# Patient Record
Sex: Female | Born: 1935 | Race: White | Hispanic: No | Smoking: Former smoker
Health system: Southern US, Community
[De-identification: ages and names within clinical notes are randomized; demographics above are authoritative.]

## PROBLEM LIST (undated history)

## (undated) DIAGNOSIS — M47815 Spondylosis without myelopathy or radiculopathy, thoracolumbar region: Secondary | ICD-10-CM

## (undated) DIAGNOSIS — I350 Nonrheumatic aortic (valve) stenosis: Secondary | ICD-10-CM

## (undated) DIAGNOSIS — Z9289 Personal history of other medical treatment: Secondary | ICD-10-CM

## (undated) DIAGNOSIS — I2510 Atherosclerotic heart disease of native coronary artery without angina pectoris: Secondary | ICD-10-CM

## (undated) DIAGNOSIS — Z951 Presence of aortocoronary bypass graft: Secondary | ICD-10-CM

## (undated) DIAGNOSIS — K2970 Gastritis, unspecified, without bleeding: Secondary | ICD-10-CM

## (undated) DIAGNOSIS — I2129 ST elevation (STEMI) myocardial infarction involving other sites: Secondary | ICD-10-CM

## (undated) DIAGNOSIS — M549 Dorsalgia, unspecified: Secondary | ICD-10-CM

## (undated) DIAGNOSIS — K55059 Acute (reversible) ischemia of intestine, part and extent unspecified: Secondary | ICD-10-CM

## (undated) DIAGNOSIS — G8929 Other chronic pain: Secondary | ICD-10-CM

## (undated) DIAGNOSIS — Z9889 Other specified postprocedural states: Secondary | ICD-10-CM

## (undated) DIAGNOSIS — I739 Peripheral vascular disease, unspecified: Secondary | ICD-10-CM

## (undated) DIAGNOSIS — E785 Hyperlipidemia, unspecified: Secondary | ICD-10-CM

## (undated) DIAGNOSIS — K565 Intestinal adhesions [bands], unspecified as to partial versus complete obstruction: Secondary | ICD-10-CM

## (undated) DIAGNOSIS — I25810 Atherosclerosis of coronary artery bypass graft(s) without angina pectoris: Secondary | ICD-10-CM

## (undated) DIAGNOSIS — I779 Disorder of arteries and arterioles, unspecified: Secondary | ICD-10-CM

## (undated) DIAGNOSIS — Z8679 Personal history of other diseases of the circulatory system: Secondary | ICD-10-CM

## (undated) DIAGNOSIS — K3184 Gastroparesis: Secondary | ICD-10-CM

## (undated) DIAGNOSIS — I701 Atherosclerosis of renal artery: Secondary | ICD-10-CM

## (undated) DIAGNOSIS — M479 Spondylosis, unspecified: Secondary | ICD-10-CM

## (undated) DIAGNOSIS — I10 Essential (primary) hypertension: Secondary | ICD-10-CM

## (undated) DIAGNOSIS — I771 Stricture of artery: Secondary | ICD-10-CM

## (undated) DIAGNOSIS — K279 Peptic ulcer, site unspecified, unspecified as acute or chronic, without hemorrhage or perforation: Secondary | ICD-10-CM

## (undated) HISTORY — DX: Spondylosis without myelopathy or radiculopathy, thoracolumbar region: M47.815

## (undated) HISTORY — DX: Personal history of other diseases of the circulatory system: Z86.79

## (undated) HISTORY — DX: Gastritis, unspecified, without bleeding: K29.70

## (undated) HISTORY — DX: Dorsalgia, unspecified: M54.9

## (undated) HISTORY — DX: Stricture of artery: I77.1

## (undated) HISTORY — DX: Presence of aortocoronary bypass graft: Z95.1

## (undated) HISTORY — DX: Peptic ulcer, site unspecified, unspecified as acute or chronic, without hemorrhage or perforation: K27.9

## (undated) HISTORY — DX: Spondylosis, unspecified: M47.9

## (undated) HISTORY — DX: ST elevation (STEMI) myocardial infarction involving other sites: I21.29

## (undated) HISTORY — DX: Intestinal adhesions (bands), unspecified as to partial versus complete obstruction: K56.50

## (undated) HISTORY — DX: Gastroparesis: K31.84

## (undated) HISTORY — DX: Atherosclerotic heart disease of native coronary artery without angina pectoris: I25.10

## (undated) HISTORY — DX: Disorder of arteries and arterioles, unspecified: I77.9

## (undated) HISTORY — DX: Peripheral vascular disease, unspecified: I73.9

## (undated) HISTORY — DX: Hyperlipidemia, unspecified: E78.5

## (undated) HISTORY — DX: Other specified postprocedural states: Z98.890

## (undated) HISTORY — DX: Atherosclerosis of coronary artery bypass graft(s) without angina pectoris: I25.810

## (undated) HISTORY — DX: Other chronic pain: G89.29

## (undated) HISTORY — PX: FEMORAL-TIBIAL BYPASS GRAFT: SHX938

## (undated) HISTORY — PX: CAROTID ENDARTERECTOMY: SUR193

## (undated) HISTORY — DX: Essential (primary) hypertension: I10

## (undated) HISTORY — DX: Nonrheumatic aortic (valve) stenosis: I35.0

## (undated) HISTORY — DX: Acute (reversible) ischemia of intestine, part and extent unspecified: K55.059

## (undated) HISTORY — DX: Atherosclerosis of renal artery: I70.1

## (undated) HISTORY — PX: CAROTID ARTERY - SUBCLAVIAN ARTERY BYPASS GRAFT: SUR178

## (undated) HISTORY — DX: Personal history of other medical treatment: Z92.89

---

## 1974-08-29 DIAGNOSIS — I2129 ST elevation (STEMI) myocardial infarction involving other sites: Secondary | ICD-10-CM

## 1974-08-29 HISTORY — DX: ST elevation (STEMI) myocardial infarction involving other sites: I21.29

## 1996-08-29 HISTORY — PX: OTHER SURGICAL HISTORY: SHX169

## 1996-08-29 HISTORY — PX: PR VEIN BYPASS GRAFT,AORTO-FEM-POP: 35551

## 1998-08-29 DIAGNOSIS — Z951 Presence of aortocoronary bypass graft: Secondary | ICD-10-CM

## 1998-08-29 HISTORY — PX: CORONARY ARTERY BYPASS GRAFT: SHX141

## 1998-08-29 HISTORY — DX: Presence of aortocoronary bypass graft: Z95.1

## 1999-03-29 ENCOUNTER — Emergency Department (HOSPITAL_COMMUNITY): Admission: EM | Admit: 1999-03-29 | Discharge: 1999-03-29 | Payer: Self-pay | Admitting: Emergency Medicine

## 1999-04-02 ENCOUNTER — Emergency Department (HOSPITAL_COMMUNITY): Admission: EM | Admit: 1999-04-02 | Discharge: 1999-04-02 | Payer: Self-pay | Admitting: Emergency Medicine

## 1999-05-28 ENCOUNTER — Encounter: Payer: Self-pay | Admitting: Emergency Medicine

## 1999-05-28 ENCOUNTER — Inpatient Hospital Stay (HOSPITAL_COMMUNITY): Admission: EM | Admit: 1999-05-28 | Discharge: 1999-06-09 | Payer: Self-pay | Admitting: Cardiology

## 1999-06-01 ENCOUNTER — Encounter: Payer: Self-pay | Admitting: Cardiology

## 1999-06-03 ENCOUNTER — Encounter: Payer: Self-pay | Admitting: Cardiothoracic Surgery

## 1999-06-04 ENCOUNTER — Encounter: Payer: Self-pay | Admitting: Cardiothoracic Surgery

## 1999-06-05 ENCOUNTER — Encounter: Payer: Self-pay | Admitting: Cardiothoracic Surgery

## 1999-06-06 ENCOUNTER — Encounter: Payer: Self-pay | Admitting: Cardiothoracic Surgery

## 1999-06-07 ENCOUNTER — Encounter: Payer: Self-pay | Admitting: Cardiothoracic Surgery

## 2000-02-21 ENCOUNTER — Other Ambulatory Visit: Admission: RE | Admit: 2000-02-21 | Discharge: 2000-02-21 | Payer: Self-pay | Admitting: Obstetrics and Gynecology

## 2001-03-23 ENCOUNTER — Other Ambulatory Visit: Admission: RE | Admit: 2001-03-23 | Discharge: 2001-03-23 | Payer: Self-pay | Admitting: Obstetrics and Gynecology

## 2002-08-29 HISTORY — PX: COLON SURGERY: SHX602

## 2002-08-29 HISTORY — PX: CORONARY STENT PLACEMENT: SHX1402

## 2002-08-29 HISTORY — PX: RENAL ARTERY STENT: SHX2321

## 2002-09-29 DIAGNOSIS — Z8679 Personal history of other diseases of the circulatory system: Secondary | ICD-10-CM

## 2002-09-29 DIAGNOSIS — I25810 Atherosclerosis of coronary artery bypass graft(s) without angina pectoris: Secondary | ICD-10-CM

## 2002-09-29 HISTORY — DX: Atherosclerosis of coronary artery bypass graft(s) without angina pectoris: I25.810

## 2002-09-29 HISTORY — DX: Personal history of other diseases of the circulatory system: Z86.79

## 2002-09-30 ENCOUNTER — Inpatient Hospital Stay (HOSPITAL_COMMUNITY): Admission: EM | Admit: 2002-09-30 | Discharge: 2002-10-03 | Payer: Self-pay | Admitting: Emergency Medicine

## 2002-10-02 ENCOUNTER — Encounter (INDEPENDENT_AMBULATORY_CARE_PROVIDER_SITE_OTHER): Payer: Self-pay | Admitting: Specialist

## 2002-12-04 ENCOUNTER — Encounter: Payer: Self-pay | Admitting: Cardiovascular Disease

## 2002-12-04 ENCOUNTER — Ambulatory Visit (HOSPITAL_COMMUNITY): Admission: RE | Admit: 2002-12-04 | Discharge: 2002-12-07 | Payer: Self-pay | Admitting: Cardiovascular Disease

## 2003-05-30 DIAGNOSIS — K565 Intestinal adhesions [bands], unspecified as to partial versus complete obstruction: Secondary | ICD-10-CM

## 2003-05-30 HISTORY — PX: LAPAROSCOPIC LYSIS OF ADHESIONS: SHX5905

## 2003-05-30 HISTORY — DX: Intestinal adhesions (bands), unspecified as to partial versus complete obstruction: K56.50

## 2003-06-09 ENCOUNTER — Encounter: Payer: Self-pay | Admitting: Internal Medicine

## 2003-06-09 ENCOUNTER — Ambulatory Visit (HOSPITAL_COMMUNITY): Admission: RE | Admit: 2003-06-09 | Discharge: 2003-06-09 | Payer: Self-pay | Admitting: Internal Medicine

## 2003-06-13 ENCOUNTER — Encounter: Payer: Self-pay | Admitting: Emergency Medicine

## 2003-06-14 ENCOUNTER — Encounter: Payer: Self-pay | Admitting: Emergency Medicine

## 2003-06-14 ENCOUNTER — Encounter: Payer: Self-pay | Admitting: Internal Medicine

## 2003-06-15 ENCOUNTER — Inpatient Hospital Stay (HOSPITAL_COMMUNITY): Admission: EM | Admit: 2003-06-15 | Discharge: 2003-06-25 | Payer: Self-pay | Admitting: Emergency Medicine

## 2003-06-16 ENCOUNTER — Encounter: Payer: Self-pay | Admitting: Internal Medicine

## 2003-06-27 ENCOUNTER — Inpatient Hospital Stay (HOSPITAL_COMMUNITY): Admission: EM | Admit: 2003-06-27 | Discharge: 2003-07-01 | Payer: Self-pay | Admitting: Emergency Medicine

## 2003-07-08 ENCOUNTER — Inpatient Hospital Stay (HOSPITAL_COMMUNITY): Admission: EM | Admit: 2003-07-08 | Discharge: 2003-07-12 | Payer: Self-pay | Admitting: Emergency Medicine

## 2004-08-24 ENCOUNTER — Encounter: Admission: RE | Admit: 2004-08-24 | Discharge: 2004-11-02 | Payer: Self-pay | Admitting: Internal Medicine

## 2004-11-06 ENCOUNTER — Emergency Department (HOSPITAL_COMMUNITY): Admission: EM | Admit: 2004-11-06 | Discharge: 2004-11-06 | Payer: Self-pay | Admitting: Emergency Medicine

## 2005-02-15 ENCOUNTER — Encounter: Admission: RE | Admit: 2005-02-15 | Discharge: 2005-02-15 | Payer: Self-pay | Admitting: Gastroenterology

## 2005-02-25 ENCOUNTER — Inpatient Hospital Stay (HOSPITAL_COMMUNITY): Admission: EM | Admit: 2005-02-25 | Discharge: 2005-03-02 | Payer: Self-pay | Admitting: Emergency Medicine

## 2005-03-05 ENCOUNTER — Emergency Department (HOSPITAL_COMMUNITY): Admission: EM | Admit: 2005-03-05 | Discharge: 2005-03-05 | Payer: Self-pay | Admitting: Emergency Medicine

## 2005-04-27 ENCOUNTER — Ambulatory Visit (HOSPITAL_COMMUNITY): Admission: RE | Admit: 2005-04-27 | Discharge: 2005-04-27 | Payer: Self-pay | Admitting: Gastroenterology

## 2005-07-02 ENCOUNTER — Emergency Department (HOSPITAL_COMMUNITY): Admission: EM | Admit: 2005-07-02 | Discharge: 2005-07-02 | Payer: Self-pay | Admitting: Emergency Medicine

## 2005-08-29 HISTORY — PX: SUPERIOR MESTENTERIC ARTERY STENT: SHX2461

## 2005-10-18 ENCOUNTER — Encounter: Admission: RE | Admit: 2005-10-18 | Discharge: 2005-10-18 | Payer: Self-pay | Admitting: Gastroenterology

## 2006-01-10 ENCOUNTER — Inpatient Hospital Stay (HOSPITAL_COMMUNITY): Admission: EM | Admit: 2006-01-10 | Discharge: 2006-01-20 | Payer: Self-pay | Admitting: Emergency Medicine

## 2006-01-13 ENCOUNTER — Encounter (INDEPENDENT_AMBULATORY_CARE_PROVIDER_SITE_OTHER): Payer: Self-pay | Admitting: *Deleted

## 2006-01-31 ENCOUNTER — Encounter: Admission: RE | Admit: 2006-01-31 | Discharge: 2006-01-31 | Payer: Self-pay | Admitting: Interventional Radiology

## 2007-01-12 ENCOUNTER — Inpatient Hospital Stay (HOSPITAL_COMMUNITY): Admission: EM | Admit: 2007-01-12 | Discharge: 2007-01-17 | Payer: Self-pay | Admitting: Emergency Medicine

## 2007-04-08 ENCOUNTER — Emergency Department (HOSPITAL_COMMUNITY): Admission: EM | Admit: 2007-04-08 | Discharge: 2007-04-08 | Payer: Self-pay | Admitting: Emergency Medicine

## 2007-06-01 ENCOUNTER — Ambulatory Visit: Payer: Self-pay | Admitting: Vascular Surgery

## 2008-06-12 ENCOUNTER — Encounter: Admission: RE | Admit: 2008-06-12 | Discharge: 2008-06-12 | Payer: Self-pay | Admitting: Internal Medicine

## 2008-07-04 ENCOUNTER — Encounter: Admission: RE | Admit: 2008-07-04 | Discharge: 2008-07-04 | Payer: Self-pay | Admitting: Internal Medicine

## 2008-07-11 ENCOUNTER — Ambulatory Visit: Payer: Self-pay | Admitting: Vascular Surgery

## 2008-08-29 HISTORY — PX: CARDIAC CATHETERIZATION: SHX172

## 2008-12-27 ENCOUNTER — Inpatient Hospital Stay (HOSPITAL_COMMUNITY): Admission: EM | Admit: 2008-12-27 | Discharge: 2009-01-06 | Payer: Self-pay | Admitting: Emergency Medicine

## 2008-12-27 DIAGNOSIS — Z9889 Other specified postprocedural states: Secondary | ICD-10-CM

## 2008-12-27 HISTORY — DX: Other specified postprocedural states: Z98.890

## 2009-04-16 ENCOUNTER — Ambulatory Visit: Payer: Self-pay | Admitting: Surgery

## 2009-04-25 ENCOUNTER — Emergency Department (HOSPITAL_COMMUNITY): Admission: EM | Admit: 2009-04-25 | Discharge: 2009-04-25 | Payer: Self-pay | Admitting: Emergency Medicine

## 2009-04-27 ENCOUNTER — Ambulatory Visit: Payer: Self-pay | Admitting: Vascular Surgery

## 2009-05-24 ENCOUNTER — Emergency Department (HOSPITAL_COMMUNITY): Admission: EM | Admit: 2009-05-24 | Discharge: 2009-05-24 | Payer: Self-pay | Admitting: Emergency Medicine

## 2009-07-10 ENCOUNTER — Ambulatory Visit (HOSPITAL_COMMUNITY): Admission: RE | Admit: 2009-07-10 | Discharge: 2009-07-10 | Payer: Self-pay | Admitting: Cardiovascular Disease

## 2009-08-14 ENCOUNTER — Inpatient Hospital Stay (HOSPITAL_COMMUNITY): Admission: EM | Admit: 2009-08-14 | Discharge: 2009-08-18 | Payer: Self-pay | Admitting: Emergency Medicine

## 2009-08-15 ENCOUNTER — Encounter (INDEPENDENT_AMBULATORY_CARE_PROVIDER_SITE_OTHER): Payer: Self-pay | Admitting: Internal Medicine

## 2009-08-17 ENCOUNTER — Ambulatory Visit: Payer: Self-pay | Admitting: Vascular Surgery

## 2009-08-17 ENCOUNTER — Encounter (INDEPENDENT_AMBULATORY_CARE_PROVIDER_SITE_OTHER): Payer: Self-pay | Admitting: Internal Medicine

## 2009-09-04 ENCOUNTER — Ambulatory Visit: Payer: Self-pay | Admitting: Vascular Surgery

## 2010-09-14 ENCOUNTER — Ambulatory Visit: Admit: 2010-09-14 | Payer: Self-pay | Admitting: Vascular Surgery

## 2010-11-29 LAB — DIFFERENTIAL
Basophils Absolute: 0 10*3/uL (ref 0.0–0.1)
Basophils Relative: 1 % (ref 0–1)
Eosinophils Absolute: 0.2 10*3/uL (ref 0.0–0.7)
Lymphocytes Relative: 25 % (ref 12–46)
Lymphs Abs: 1.3 10*3/uL (ref 0.7–4.0)
Monocytes Relative: 9 % (ref 3–12)
Neutro Abs: 3 10*3/uL (ref 1.7–7.7)
Neutrophils Relative %: 59 % (ref 43–77)

## 2010-11-29 LAB — PROTIME-INR: Prothrombin Time: 13 seconds (ref 11.6–15.2)

## 2010-11-29 LAB — TROPONIN I: Troponin I: 0.01 ng/mL (ref 0.00–0.06)

## 2010-11-29 LAB — CBC
HCT: 34.5 % — ABNORMAL LOW (ref 36.0–46.0)
HCT: 36.4 % (ref 36.0–46.0)
Hemoglobin: 11.6 g/dL — ABNORMAL LOW (ref 12.0–15.0)
Hemoglobin: 11.9 g/dL — ABNORMAL LOW (ref 12.0–15.0)
MCHC: 33 g/dL (ref 30.0–36.0)
MCV: 86 fL (ref 78.0–100.0)
Platelets: 160 10*3/uL (ref 150–400)
Platelets: 168 10*3/uL (ref 150–400)
RBC: 4.14 MIL/uL (ref 3.87–5.11)
RDW: 13.6 % (ref 11.5–15.5)
RDW: 13.7 % (ref 11.5–15.5)
WBC: 5 10*3/uL (ref 4.0–10.5)

## 2010-11-29 LAB — URINE CULTURE
Colony Count: NO GROWTH
Culture: NO GROWTH

## 2010-11-29 LAB — COMPREHENSIVE METABOLIC PANEL
ALT: 10 U/L (ref 0–35)
ALT: 12 U/L (ref 0–35)
AST: 20 U/L (ref 0–37)
Alkaline Phosphatase: 46 U/L (ref 39–117)
Alkaline Phosphatase: 54 U/L (ref 39–117)
CO2: 28 mEq/L (ref 19–32)
CO2: 30 mEq/L (ref 19–32)
Calcium: 9.8 mg/dL (ref 8.4–10.5)
Chloride: 107 mEq/L (ref 96–112)
GFR calc Af Amer: 56 mL/min — ABNORMAL LOW (ref >60)
GFR calc non Af Amer: 47 mL/min — ABNORMAL LOW (ref >60)
GFR calc non Af Amer: 52 mL/min — ABNORMAL LOW (ref >60)
Glucose, Bld: 109 mg/dL — ABNORMAL HIGH (ref 70–99)
Potassium: 4.3 mEq/L (ref 3.5–5.1)
Potassium: 4.6 mEq/L (ref 3.5–5.1)
Sodium: 139 mEq/L (ref 135–145)
Sodium: 141 mEq/L (ref 135–145)

## 2010-11-29 LAB — GLUCOSE, CAPILLARY
Glucose-Capillary: 102 mg/dL — ABNORMAL HIGH (ref 70–99)
Glucose-Capillary: 108 mg/dL — ABNORMAL HIGH (ref 70–99)
Glucose-Capillary: 115 mg/dL — ABNORMAL HIGH (ref 70–99)
Glucose-Capillary: 117 mg/dL — ABNORMAL HIGH (ref 70–99)
Glucose-Capillary: 122 mg/dL — ABNORMAL HIGH (ref 70–99)
Glucose-Capillary: 126 mg/dL — ABNORMAL HIGH (ref 70–99)
Glucose-Capillary: 128 mg/dL — ABNORMAL HIGH (ref 70–99)
Glucose-Capillary: 149 mg/dL — ABNORMAL HIGH (ref 70–99)
Glucose-Capillary: 96 mg/dL (ref 70–99)

## 2010-11-29 LAB — BASIC METABOLIC PANEL
BUN: 10 mg/dL (ref 6–23)
BUN: 11 mg/dL (ref 6–23)
CO2: 27 mEq/L (ref 19–32)
CO2: 28 mEq/L (ref 19–32)
Calcium: 9.3 mg/dL (ref 8.4–10.5)
Chloride: 109 mEq/L (ref 96–112)
GFR calc Af Amer: >60 mL/min (ref >60)
GFR calc non Af Amer: 50 mL/min — ABNORMAL LOW (ref >60)
GFR calc non Af Amer: 58 mL/min — ABNORMAL LOW (ref >60)
GFR calc non Af Amer: >60 mL/min (ref >60)
Glucose, Bld: 106 mg/dL — ABNORMAL HIGH (ref 70–99)
Glucose, Bld: 114 mg/dL — ABNORMAL HIGH (ref 70–99)
Potassium: 3.3 mEq/L — ABNORMAL LOW (ref 3.5–5.1)
Potassium: 3.8 mEq/L (ref 3.5–5.1)
Sodium: 143 mEq/L (ref 135–145)

## 2010-11-29 LAB — URINALYSIS, ROUTINE W REFLEX MICROSCOPIC
Bilirubin Urine: NEGATIVE
Ketones, ur: NEGATIVE mg/dL
Leukocytes, UA: NEGATIVE
Nitrite: NEGATIVE
Protein, ur: NEGATIVE mg/dL
Urobilinogen, UA: 1 mg/dL (ref 0.0–1.0)

## 2010-11-29 LAB — LIPID PANEL
Cholesterol: 93 mg/dL (ref 0–200)
LDL Cholesterol: 39 mg/dL (ref 0–99)
VLDL: 26 mg/dL (ref 0–40)

## 2010-11-29 LAB — CARDIAC PANEL(CRET KIN+CKTOT+MB+TROPI)
CK, MB: 1.6 ng/mL (ref 0.3–4.0)
Total CK: 51 U/L (ref 7–177)
Total CK: 66 U/L (ref 7–177)

## 2010-11-29 LAB — URINE MICROSCOPIC-ADD ON

## 2010-11-29 LAB — PHOSPHORUS
Phosphorus: 3 mg/dL (ref 2.3–4.6)
Phosphorus: 3.4 mg/dL (ref 2.3–4.6)

## 2010-11-29 LAB — POCT CARDIAC MARKERS
CKMB, poc: <1 ng/mL — ABNORMAL LOW (ref 1.0–8.0)
Myoglobin, poc: 58.7 ng/mL (ref 12–200)

## 2010-11-29 LAB — CK TOTAL AND CKMB (NOT AT ARMC): Relative Index: INVALID (ref 0.0–2.5)

## 2010-11-29 LAB — MAGNESIUM: Magnesium: 1.8 mg/dL (ref 1.5–2.5)

## 2010-12-03 LAB — DIFFERENTIAL
Basophils Absolute: 0 10*3/uL (ref 0.0–0.1)
Basophils Relative: 0 % (ref 0–1)
Eosinophils Absolute: 0.2 10*3/uL (ref 0.0–0.7)
Eosinophils Relative: 4 % (ref 0–5)
Lymphs Abs: 1.3 10*3/uL (ref 0.7–4.0)
Neutrophils Relative %: 57 % (ref 43–77)

## 2010-12-03 LAB — COMPREHENSIVE METABOLIC PANEL
ALT: 13 U/L (ref 0–35)
AST: 21 U/L (ref 0–37)
Alkaline Phosphatase: 55 U/L (ref 39–117)
CO2: 30 mEq/L (ref 19–32)
Calcium: 9.6 mg/dL (ref 8.4–10.5)
Chloride: 103 mEq/L (ref 96–112)
GFR calc Af Amer: 59 mL/min — ABNORMAL LOW (ref >60)
GFR calc non Af Amer: 49 mL/min — ABNORMAL LOW (ref >60)
Glucose, Bld: 132 mg/dL — ABNORMAL HIGH (ref 70–99)
Sodium: 139 mEq/L (ref 135–145)
Total Bilirubin: 0.7 mg/dL (ref 0.3–1.2)

## 2010-12-03 LAB — CBC
Hemoglobin: 11.4 g/dL — ABNORMAL LOW (ref 12.0–15.0)
MCHC: 33.8 g/dL (ref 30.0–36.0)
RBC: 3.9 MIL/uL (ref 3.87–5.11)
WBC: 4.8 10*3/uL (ref 4.0–10.5)

## 2010-12-03 LAB — URINALYSIS, ROUTINE W REFLEX MICROSCOPIC
Bilirubin Urine: NEGATIVE
Hgb urine dipstick: NEGATIVE
Nitrite: NEGATIVE
Specific Gravity, Urine: 1.029 (ref 1.005–1.030)
pH: 5.5 (ref 5.0–8.0)

## 2010-12-03 LAB — POCT CARDIAC MARKERS: Myoglobin, poc: 80.1 ng/mL (ref 12–200)

## 2010-12-04 LAB — CBC
HCT: 34.2 % — ABNORMAL LOW (ref 36.0–46.0)
MCHC: 33.4 g/dL (ref 30.0–36.0)
MCV: 87.6 fL (ref 78.0–100.0)
Platelets: 166 10*3/uL (ref 150–400)
RDW: 13.4 % (ref 11.5–15.5)
WBC: 5.3 10*3/uL (ref 4.0–10.5)

## 2010-12-04 LAB — DIFFERENTIAL
Basophils Relative: 0 % (ref 0–1)
Eosinophils Absolute: 0.1 10*3/uL (ref 0.0–0.7)
Eosinophils Relative: 3 % (ref 0–5)
Lymphs Abs: 1.2 10*3/uL (ref 0.7–4.0)
Neutrophils Relative %: 62 % (ref 43–77)

## 2010-12-04 LAB — POCT I-STAT, CHEM 8
BUN: 24 mg/dL — ABNORMAL HIGH (ref 6–23)
Calcium, Ion: 1.23 mmol/L (ref 1.12–1.32)
TCO2: 27 mmol/L (ref 0–100)

## 2010-12-04 LAB — PROTIME-INR
INR: 1.1 (ref 0.00–1.49)
Prothrombin Time: 13.7 s (ref 11.6–15.2)

## 2010-12-04 LAB — APTT

## 2010-12-07 LAB — COMPREHENSIVE METABOLIC PANEL
Alkaline Phosphatase: 47 U/L (ref 39–117)
BUN: 25 mg/dL — ABNORMAL HIGH (ref 6–23)
Chloride: 110 mEq/L (ref 96–112)
Glucose, Bld: 95 mg/dL (ref 70–99)
Potassium: 3.9 mEq/L (ref 3.5–5.1)
Total Bilirubin: 0.4 mg/dL (ref 0.3–1.2)

## 2010-12-07 LAB — BASIC METABOLIC PANEL
BUN: 14 mg/dL (ref 6–23)
BUN: 16 mg/dL (ref 6–23)
BUN: 20 mg/dL (ref 6–23)
BUN: 28 mg/dL — ABNORMAL HIGH (ref 6–23)
BUN: 30 mg/dL — ABNORMAL HIGH (ref 6–23)
CO2: 22 mEq/L (ref 19–32)
CO2: 22 mEq/L (ref 19–32)
CO2: 24 mEq/L (ref 19–32)
CO2: 25 mEq/L (ref 19–32)
CO2: 26 mEq/L (ref 19–32)
CO2: 27 mEq/L (ref 19–32)
CO2: 27 mEq/L (ref 19–32)
Calcium: 9.2 mg/dL (ref 8.4–10.5)
Calcium: 9.3 mg/dL (ref 8.4–10.5)
Calcium: 9.6 mg/dL (ref 8.4–10.5)
Chloride: 104 mEq/L (ref 96–112)
Chloride: 105 mEq/L (ref 96–112)
Chloride: 105 mEq/L (ref 96–112)
Chloride: 108 mEq/L (ref 96–112)
Chloride: 109 mEq/L (ref 96–112)
Chloride: 113 mEq/L — ABNORMAL HIGH (ref 96–112)
Creatinine, Ser: 0.96 mg/dL (ref 0.4–1.2)
Creatinine, Ser: 0.96 mg/dL (ref 0.4–1.2)
Creatinine, Ser: 1.09 mg/dL (ref 0.4–1.2)
Creatinine, Ser: 1.27 mg/dL — ABNORMAL HIGH (ref 0.4–1.2)
Creatinine, Ser: 1.3 mg/dL — ABNORMAL HIGH (ref 0.4–1.2)
GFR calc Af Amer: >60 mL/min (ref >60)
GFR calc Af Amer: >60 mL/min (ref >60)
GFR calc non Af Amer: >60 mL/min (ref >60)
Glucose, Bld: 117 mg/dL — ABNORMAL HIGH (ref 70–99)
Glucose, Bld: 144 mg/dL — ABNORMAL HIGH (ref 70–99)
Glucose, Bld: 177 mg/dL — ABNORMAL HIGH (ref 70–99)
Glucose, Bld: 91 mg/dL (ref 70–99)
Potassium: 3.5 mEq/L (ref 3.5–5.1)
Potassium: 3.6 mEq/L (ref 3.5–5.1)
Potassium: 4.8 mEq/L (ref 3.5–5.1)
Sodium: 138 mEq/L (ref 135–145)
Sodium: 139 mEq/L (ref 135–145)

## 2010-12-07 LAB — DIFFERENTIAL
Basophils Relative: 0 % (ref 0–1)
Eosinophils Absolute: 0.1 10*3/uL (ref 0.0–0.7)
Lymphs Abs: 0.9 10*3/uL (ref 0.7–4.0)
Neutrophils Relative %: 74 % (ref 43–77)

## 2010-12-07 LAB — CBC
HCT: 35.7 % — ABNORMAL LOW (ref 36.0–46.0)
HCT: 37.7 % (ref 36.0–46.0)
MCHC: 34.4 g/dL (ref 30.0–36.0)
MCHC: 34.5 g/dL (ref 30.0–36.0)
MCHC: 34.6 g/dL (ref 30.0–36.0)
MCHC: 34.8 g/dL (ref 30.0–36.0)
MCV: 89.8 fL (ref 78.0–100.0)
MCV: 90.3 fL (ref 78.0–100.0)
MCV: 90.4 fL (ref 78.0–100.0)
MCV: 90.5 fL (ref 78.0–100.0)
MCV: 91.4 fL (ref 78.0–100.0)
Platelets: 203 10*3/uL (ref 150–400)
Platelets: 239 10*3/uL (ref 150–400)
Platelets: 256 10*3/uL (ref 150–400)
RBC: 3.95 MIL/uL (ref 3.87–5.11)
RDW: 13.6 % (ref 11.5–15.5)
WBC: 5.6 10*3/uL (ref 4.0–10.5)
WBC: 7.4 10*3/uL (ref 4.0–10.5)
WBC: 9.1 10*3/uL (ref 4.0–10.5)

## 2010-12-07 LAB — PROTIME-INR
INR: 1 (ref 0.00–1.49)
INR: 1.1 (ref 0.00–1.49)
Prothrombin Time: 13.8 seconds (ref 11.6–15.2)
Prothrombin Time: 14.6 seconds (ref 11.6–15.2)

## 2010-12-07 LAB — MAGNESIUM: Magnesium: 2.1 mg/dL (ref 1.5–2.5)

## 2010-12-07 LAB — CARDIAC PANEL(CRET KIN+CKTOT+MB+TROPI)
CK, MB: 1.6 ng/mL (ref 0.3–4.0)
CK, MB: 2.1 ng/mL (ref 0.3–4.0)
Relative Index: INVALID (ref 0.0–2.5)
Relative Index: INVALID (ref 0.0–2.5)
Total CK: 27 U/L (ref 7–177)
Total CK: 38 U/L (ref 7–177)

## 2010-12-07 LAB — POCT CARDIAC MARKERS
CKMB, poc: <1 ng/mL — ABNORMAL LOW (ref 1.0–8.0)
Myoglobin, poc: 53.9 ng/mL (ref 12–200)

## 2010-12-07 LAB — HEPARIN LEVEL (UNFRACTIONATED)
Heparin Unfractionated: 0.68 IU/mL (ref 0.30–0.70)
Heparin Unfractionated: 0.76 IU/mL — ABNORMAL HIGH (ref 0.30–0.70)

## 2010-12-07 LAB — LIPID PANEL
LDL Cholesterol: 59 mg/dL (ref 0–99)
Triglycerides: 95 mg/dL (ref <150)

## 2010-12-07 LAB — POCT I-STAT, CHEM 8
HCT: 35 % — ABNORMAL LOW (ref 36.0–46.0)
Hemoglobin: 11.9 g/dL — ABNORMAL LOW (ref 12.0–15.0)
Potassium: 3.9 mEq/L (ref 3.5–5.1)
Sodium: 142 mEq/L (ref 135–145)

## 2011-01-11 NOTE — Discharge Summary (Signed)
Betty Floyd, Betty Floyd             ACCOUNT NO.:  [id number]   MEDICAL RECORD NO.:  [id number]          PATIENT TYPE:  INP   LOCATION:  1403                         FACILITY:  [facility]   PHYSICIAN:  Kari Baars, M.D.  DATE OF BIRTH:  1933/04/19   DATE OF ADMISSION:  01/12/2007  DATE OF DISCHARGE:  01/16/2007                               DISCHARGE SUMMARY   DISCHARGE DIAGNOSES:  1. Major depression disorder.  2. Generalized weakness/deconditioning.  3. Gastroparesis.  4. Urinary tract infection.  5. Parkinson's features secondary to medications (Librax and      domperidone).  6. Coronary artery disease status post myocardial infarction and      coronary artery bypass grafting (2000).  7. Mesenteric ischemia status post proximal superior mesenteric artery      stent placement (May 7).  8. History of bowel obstruction status post resection.  9. Hyperlipidemia.  10.Peripheral vascular disease status post multiple bypass.  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  12.Status post cholecystectomy status post appendectomy.   DISCHARGE MEDICATIONS:  1. Cymbalta 30 mg daily for one week, then 60 mg daily.  2. Cipro 500 mg b.i.d. for two day.  3. Xanax 0.5 mg b.i.d. p.r.n.  4. Aspirin 81 mg daily.  5. Lipitor 20 mg daily.  6. Calcium plus vitamin D b.i.d.  7. Pletal 100 mg b.i.d.  8. Plavix 75 mg daily.  9. TriCor 145 mg daily.  10.Imdur 30 mg daily.  11.Lopressor 100 mg daily.  12.Protonix 40 mg daily.  13.K-Dur 40 mEq daily.  14.MiraLax 17 g one 2x daily as needed for constipation.  15.Stop Zoloft, clidinium and domperidone.   HOSPITAL PROCEDURES:  1. CT of the head without contrast (May 16) no acute intracranial      abnormalities.  Very mild non-specific white-matter changes, may be      related to small vessel disease.  2. MRI without contrast (May 17).  No acute abnormalities.  Mild white-      matter changes.   HISTORY OF PRESENT ILLNESS:  For  full details please see dictated  history and physical by Dr. Timothy Lasso.  Briefly, Betty Floyd is a 77-  year-old white female with severe peripheral vascular disease and  coronary artery disease status post coronary artery bypass grafting,  multiple lower extremity bypasses, and SMA stents, ischemic  gastroparesis, and recurrent urinary tract infections, who presented to  the emergency department on May 16, with failure to thrive.  There has  been no __________ for the last two months and has had no motivation to  get out of bed.  She has been severely depressed and tearful.  She is  not adventuresome, persuing her activities.  Her daughter has been  checking on her periodically.  Intermittently, she has worsening nausea.  Her nausea has been extensively evaluated in the past, and she has been  found to have gastroparesis.  This has been treated with multiple  different medications including Reglan and more recently Librax and  domperidone.  Her daughter had called recently thinking that the Zoloft  was making her catatonic, so this was decreased.  This in fact did not  help at all.  She was brought to the emergency department for further  evaluation, where she was found to have generalized weakness with no  clinical abnormalities.  Head CT was performed and she had no  abnormalities.  Urinalysis was mildly abnormal.  She was admitted for  further management.   HOSPITAL COURSE:  The patient was admitted to a medical bed.  She was  placed on empiric treatment for urinary tract infection with Cipro.  Her  Zoloft was discontinued, and she was started on Cymbalta for major  depression.  MRI was performed, which showed no acute abnormalities.  On  my exam, there was a striking difference in the patient's mood.  She had  very relaxed facies.  She reports unstable gait.  In this setting, the  concern for Parkinson's features due to her gastroparesis drugs was very  high.  The Librax and  domperidone were discontinued.  She was continued  on Cymbalta.  Psychiatry consult was obtained and Dr. Jeanie Sewer agreed  with Cymbalta.  Initially, he endorsed inpatient geriatric/psychiatric  hospitalization. However, after speaking with the patient and her  daughter, they decided they would prefer for her to return home.  I am  concerned about her continued depression.  She denies any suicidal  ideation at this point.  She appears to be improving slowly.  Her  generalized weakness has improved, and she is able to ambulate with a  walker.  At this point, she is stable for discharge home and placed on  patient followup.   DISCHARGE LABS:  CBC shows a white count of 0.3, hemoglobin 11.1,  platelets 190.  BMET significant for sodium 140, potassium 3.8, chloride  111, bicarbonates 24, BUN 7, creatinine 0.85, glucose 113, albumin 2.7.  Liver function test normal.  Urine culture with Klebsiella greater than  100,000 colonies since she takes the Cipro.  The 25-hydroxy vitamin D  level 35.  CK on admission 49.  Vitamin B12 358.  TSH 1.27.   DISCHARGE DIET:  Cardiac-prudent diet.   HOSPITAL FOLLOWUP:  She will follow up with Dr. Clelia Croft in two weeks.  She  should follow up with Dr. Alycia Rossetti at [facility] for her gastroparesis.   DISPOSITION:  To home.      Kari Baars, M.D.  Electronically Signed     WS/MEDQ  D:  01/17/2007  T:  01/17/2007  Job:  [id number]

## 2011-01-11 NOTE — Consult Note (Signed)
NAMEGAYANNE, PRESCOTT             ACCOUNT NO.:  [id number]   MEDICAL RECORD NO.:  [id number]          PATIENT TYPE:  INP   LOCATION:  1403                         FACILITY:  [facility]   PHYSICIAN:  Antonietta Breach, M.D.  DATE OF BIRTH:  [birth year]   DATE OF CONSULTATION:  01/15/2007  DATE OF DISCHARGE:  01/17/2007                                 CONSULTATION   REQUESTING PHYSICIAN:  Kari Baars, M.D.   REASON FOR CONSULTATION:  Severe depression.   HISTORY OF PRESENT ILLNESS:  Mrs. Zyaira Vejar is a 77 year old  female admitted here to the [facility] on Jan 12, 2007.   The patient has been suffering with confusion, failure to thrive and a  possible UTI.  She also has severe depression.  The patient has several  weeks of progressive decreased energy, depressed mood, anhedonia, and  poor concentration.  She had been tried on Zoloft and the family was  concerned that the Zoloft was actually making the patient feel worse.   Mrs. Dawe has not been able to help with her own ADLs and she is also  been having difficulty answering the phone.  At times she will not  recognize family members and will not recall names.   PAST PSYCHIATRIC HISTORY:  Mrs. Irigoyen has been experiencing severe  depression for several weeks and has been tried on Zoloft which the  family reported was making the patient feel worse.   In the past, as far back as October 2004, the patient was treated with  Elavil 50 mg at bedtime.   The patient also has a history of excessive worry and feeling on edge as  well as muscle tension.  In 2004 she was treated with Valium 5 mg three  times a day for this condition.   She continued with Valium through 2006.  In May 2007 she was switched to  Xanax 1 mg t.i.d. and she was utilizing Restoril 30 mg at bedtime p.r.n.  insomnia.  In May 2007 she was on Zoloft 50 mg q.a.m. Please see the  above discussion.   FAMILY PSYCHIATRIC HISTORY:  None known   SOCIAL  HISTORY:  The patient does not use alcohol or tobacco.  She has  no history of illegal drugs.  Her daughter visits often and is at the  bedside regularly.  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  Marital status:  Divorced.  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   GENERAL MEDICAL PROBLEMS:  1. Failure to thrive.  2. Coronary artery disease with a myocardial infarction in 2000.  She      had a CABG in 2000.  3. Peripheral vascular disease.  4. Bowel obstruction with a history of resection.  5. Hyperlipidemia.  6. Gastroparesis.  7. Hypotonic bladder.  8. SMA stent.  9. Cholecystectomy.  10.Appendectomy.   MEDICATIONS:  The MAR is reviewed.  The patient has been receiving Xanax  1 mg q.8h. without adverse effect.   ALLERGIES:  1. CONTRAST MEDIA.  2. ALTACE.  3. __________ .  4. PHENERGAN.  5. VICODIN.   LABORATORY DATA:  WBC 4.5, hemoglobin  11.1, platelet count 177,000.  BUN  7, creatinine 0.93, SGOT 21, SGPT 13, total protein 5, albumin 2.7,  calcium 9.6.  Her amylase is normal.  Her B12 was normal.  Her magnesium  was low at 1.4.  Her TSH was normal.  Her vitamin D was normal.   Head CT without contrast unremarkable.  MR of the brain without contrast  showed no acute abnormality.  There were mild white matter changes.   REVIEW OF SYSTEMS:  CONSTITUTIONAL:  Afebrile.  HEAD:  No trauma.  EYES:  No visual changes.  EARS:  No hearing impairment.  NOSE:  No rhinorrhea.  MOUTH/THROAT:  No sore throat.  NEUROLOGIC:  Unremarkable.  PSYCHIATRIC:  As above.  CARDIOVASCULAR:  No chest pain.  RESPIRATORY:  No coughing or  wheezing.  GASTROINTESTINAL:  No nausea, vomiting, diarrhea.  GENITOURINARY:  No dysuria.  SKIN:  Unremarkable.  ENDOCRINE/METABOLIC:  Unremarkable.  MUSCULOSKELETAL:  No deformities.  HEMATOLOGIC/LYMPHATIC:  Unremarkable.   PHYSICAL EXAMINATION:  VITAL SIGNS:  Temperature 97.4, pulse 78,  respirations 18, blood pressure 133/69, oxygen saturation on room air  95%.    MENTAL STATUS EXAM:  Mrs. Steuck is an elderly female appearing her  chronologic age, partially reclined in her hospital chair.  Alert with  good eye contact.  Her affect is very constricted.  Her mood is  depressed.  She is oriented to all spheres.  Her memory is intact to  immediate, recent and remote.  Her fund of knowledge and intelligence  are grossly within normal limits.  Her speech involves normal rate with  a slightly flat prosody.  Thought process is logical, coherent, goal-  directed.  No looseness of associations.  Thought content:  No thoughts  of harming herself, no thoughts of harming others, no delusions, no  hallucinations.  Concentration is decreased.  Insight is intact for her  depression.  Judgment is grossly intact.  She is well-groomed.   ASSESSMENT:  AXIS I:  1.  293.83  Mood disorder, not otherwise  specified, depressed (functional and general medical factors)  1. 296.33.  Rule out major depressive disorder recurrent, severe.  2. 293.84. Anxiety disorder, not otherwise specified.  AXIS II:  None.  AXIS III:  See general medical problems.  AXIS IV:  General medical.  AXIS V:  50.   Mrs. Flett is not at risk to harm herself or others.  She agrees to  call emergency services immediately for any thoughts of harming herself  or distress.   Education was provided to the patient and her daughter.  The  indications, alternatives and adverse effects of Cymbalta were  discussed.  They understand would like to use Cymbalta for  antidepression.  The [facility] component of Cymbalta should be able to help  with her anxiety which will lead to a reduction in the benzodiazepine  usage.   RECOMMENDATIONS:  Would continue with the initial dose of Cymbalta 30 mg  q.a.m. and then would titrate to 30 mg b.i.d. at 8 a.m. and 4 p.m. as  tolerated.   Psychiatric outpatient follow-up is available at the clinics attached to [facility], [place], or [facility].      Antonietta Breach, M.D.  Electronically Signed     JW/MEDQ  D:  01/21/2007  T:  01/22/2007  Job:  [id number]

## 2011-01-11 NOTE — Procedures (Signed)
CAROTID DUPLEX EXAM   INDICATION:  Follow up known carotid artery disease.   HISTORY:  Diabetes:  No.  Cardiac:  CABG.  Hypertension:  Yes.  Smoking:  No.  Previous Surgery:  Left carotid endarterectomy in 1995.  CV History:  Amaurosis Fugax No, Paresthesias No, Hemiparesis No.                                       RIGHT             LEFT  Brachial systolic pressure:         113               116  Brachial Doppler waveforms:         Biphasic          Biphasic  Vertebral direction of flow:        Antegrade         Antegrade  DUPLEX VELOCITIES (cm/sec)  CCA peak systolic                   69                60  ECA peak systolic                   131               123  ICA peak systolic                   142               110 (mid)  ICA end diastolic                   33                29  PLAQUE MORPHOLOGY:                  Calcified         Calcified  PLAQUE AMOUNT:                      Moderate          Mild  PLAQUE LOCATION:                    ICA               ICA   IMPRESSION:  1. 40-59% stenosis noted in the right internal carotid artery.  2. 1-39% stenosis noted in the left internal carotid artery.  3. Status post left carotid endarterectomy.  4. Antegrade bilateral vertebral arteries.   ___________________________________________  Larina Earthly, M.D.   MG/MEDQ  D:  07/11/2008  T:  07/11/2008  Job:  [id number]

## 2011-01-11 NOTE — Procedures (Signed)
CAROTID DUPLEX EXAM   INDICATION:  Follow up left carotid end arterectomy in 1995.   HISTORY:  Diabetes:  No.  Cardiac:  CABG.  Hypertension:  Yes.  Smoking:  No.  Previous Surgery:  No.  CV History:  No.  Amaurosis Fugax No, Paresthesias No, Hemiparesis No                                       RIGHT             LEFT  Brachial systolic pressure:         128               113  Brachial Doppler waveforms:         Biphasic          Biphasic  Vertebral direction of flow:        Antegrade         Antegrade  DUPLEX VELOCITIES (cm/sec)  CCA peak systolic                   46                90  ECA peak systolic                   167               68  ICA peak systolic                   137               70  ICA end diastolic                   31                9  PLAQUE MORPHOLOGY:                  Calcified         Calcified  PLAQUE AMOUNT:                      Mild              Mild  PLAQUE LOCATION:                    ICA, ECA          ICA, ECA   IMPRESSION:  1. A 40-59% stenosis noted in the right internal carotid artery.  2. Normal carotid duplex with minimal intimal calcification noted in      the left internal carotid artery.  Status post left carotid end      arterectomy.  Antegrade bilateral vertebral arteries.   ___________________________________________  Larina Earthly, M.D.   MG/MEDQ  D:  06/01/2007  T:  06/02/2007  Job:  [id number]

## 2011-01-11 NOTE — Cardiovascular Report (Signed)
[name], Floyd             ACCOUNT NO.:  [id number]   MEDICAL RECORD NO.:  [id number]          PATIENT TYPE:  INP   LOCATION:  2918                         FACILITY:  [facility]   PHYSICIAN:  Richard A. Alanda Amass, M.D.DATE OF BIRTH:  11-Jan-1933   DATE OF PROCEDURE:  12/29/2008  DATE OF DISCHARGE:                            CARDIAC CATHETERIZATION   PROCEDURE:  Retrograde central aortic catheterization, selective  coronary angiography by Judkins technique pre and postop IC  nitroglycerin administration, saphenous vein graft angiography,  subselective left internal mammary artery angiogram, right  brachiocephalic angiogram, left common carotid angiogram, aortic arch  angiogram, PA using digital subtraction angiography, left ventricular  angiogram in RAO and LAO projection, abdominal aortic angiogram using  digital subtraction angiography in PA and lateral projection, left lower  extremity angiography with runoff.   Betty Floyd is a 77 year old divorced white grandmother of two who quit  smoking over 15 years ago.  She has a long history of coronary artery  disease dating back to the DMI at age 31 with multiple prior  interventions, subsequent CABG in 2000, and subsequent PCI with DES  stenting of the mid circumflex in 2004.  She has also had remote left  carotid subclavian bypass surgery prior to her CABG and remote bypass  surgery of her right lower extremity for ischemic disease by Dr. Hendricks Milo including fem-fem crossover bypass graft.  Because of progression  disease, subsequent aortobifemoral bypass graft by Dr. Micah Noel.  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 Fredia Sorrow relieving most of her  symptoms.  She has complex peripheral and coronary anatomy and  presented  to the hospital with chest pain and epigastric and right lower quadrant  discomfort.  Angiography by myocardial infarction was ruled out by  serial enzymes and EKGs and angiography was necessary to assess her  mesenteric anatomy for any restenosis along with coronary and graft  anatomy because of recurrent chest pain.  Informed consent was obtained  from the patient and family to proceed with angiography.   The patient was brought to the second floor PV lab in postabsorptive  state after premedication with 5 mg of Valium p.o.  The right groin was  prepped, draped in usual manner, 1% Xylocaine was used for local  anesthesia and the patient was given total of 4 mg of Versed for  sedation in divided doses during the procedure.  Because of the past  knowledge where the right limb of her ABF BP was positioned, I was able  to enter the right limb directly in the medial position after pulsation  just above the iliac crease.  The graft was relatively superficial at  this point with good flow.  A 0.035 extra stiff Amplatz J-tip wire was  used to traverse the patient's graft and a 6-French side-arm sheath was  then inserted through the scar tissue without much difficulty.  Guidewire  exchange was used throughout the procedure with a regular  0.035-inch J-tip guidewire.  Selective coronary angiography was done  with 6-French 3.5-cm left coronary catheter and 6-French 4 cm taper  right coronary Cordis catheter.  Graft angiography to the SVG to the RCA  was done with the right coronary catheter.  The graft to the free radial  graft to the circumflex was previously occluded and occluded on this  study.   Subselective LIMA injections were done through the right common carotid  using the right coronary catheter, positioned in the middle right common  carotid with visualization through the left subclavian carotid bypass.  Catheter was then exchanged for 6-French pigtail catheter and LV   angiogram was done in the RAO and LAO projection at 25 mL, 14 mL per  second RAO and 28 mL, 12 mL per second LAO projection.  Pullback  pressure to the CA showed no gradient across the aortic valve.  Catheter  was then pulled down above the level of the renal artery and using DSA,  abdominal aortic angiogram was done in the midstream PA and lateral  projections at 20 mL, 20 mL per second for each injection.  Hand  injection of the right SFA profunda junction was performed.  Abdominal  injection was then done with a catheter above the grafted iliac  bifurcation with left lower extremity runoff at 40 mL, 10 mL per second  with visualization down to the left foot.  Catheters were removed.  Side-  arm sheath was flushed.  The patient was given 10 mg of labetalol for  pressure reduction, which reduced her systolic pressure of from 190-202  to 150 mmHg.  She had also been given 200 mcg of IC nitroglycerin into  the native left coronary artery during coronary angiography.  Catheter  was removed over the guide wire and guide wire removed.  The patient was  brought to the holding area for sheath removal and pressure hemostasis  in stable condition.  She tolerated the procedure well.   PRESSURES:  LV:  190/10; LVEDP 22-24 mmHg.  CA:  90/82 mmHg.   There was no gradient across the aortic valve on catheter pullback.   LV angiogram in the RAO and LAO projection showed hypokinesis of the mid  anterolateral wall, akinesis of the distal quarter of the inferior and  apical segment and hypo-akinesis of the posterior apical segment.  Overall ejection fraction was approximately 40-45%, estimated with no  mitral regurgitation and no LV thrombus visualized.   CORONARY ANGIOGRAPHY:  The main left coronary artery had irregularity  and 10-20% narrowing of distal third, but good residual luminal flow.   The proximal LAD had approximately 60% ostial proximal narrowing with  decreased dye density with good  flow.  There was a large trifurcating  septal perforator followed by two small diagonal branches that had  recurrent 60% narrowing between them and then total occlusion of the LAD  at the proximal third with no further antegrade filling.  There was good  filling of both diagonal branches.   The circumflex artery gave off a small first marginal followed by three  other marginals.  The proximal previously placed DES stent covering the  second OM branch, was widely patent, smooth with less than 30%  narrowing.   The second mid circumflex stent positioned across the OM 2 and OM 3, was  widely patent and smooth with less than 10% narrowing throughout most of  the stent and less than 20%  narrowing in the distal third of the stent  with excellent flow.  There was 30% smooth eccentric narrowing of the  circumflex proper beyond the stented area with good flow to the distal  marginal.   The OM 2 had approximately 80-90% ostial narrowing, but good flow and  the OM 3 had 80-90% ostial narrowing with good flow, both were  relatively small vessels.  The distal circumflex had excellent flow.   The right coronary was totally occluded in its midportion after an RV  branch.   The free radial graft was totally occluded.  The circumflex which is an  old finding.   The SVG to RCA was widely patent, smooth with excellent flow with an  excellent anastomosis to the PDA.  There was four sent lesions beyond  the anastomosis.  There was good filling of the bifurcating moderate-  sized PDA and good retrograde filling of the PLA branch with trifurcated  distally.   The LIMA came off normally from the subclavian artery, but the  subclavian artery was totally occluded from its origin of the arch.   There was a bovine type 1 arch with widely patent left common carotid  and right brachiocephalic arteries.  The RDCA was tortuous but widely  patent.  The RCCA proximally was widely patent.  The right vertebral  was  antegrade in the RIMA on graft that was visualizing.   The LCCA was widely patent.  There was an excellent anastomosis of the  left carotid - SCA graft with anastomosis just proximal to the LICA and  LECA bifurcation, which was widely patent.  The graft was widely patent  and anastomosed to the subclavian, proximal to the origin of the  vertebral artery, which had good antegrade flow.  The LIMA was well  visualized through the left with no stenosis and had an excellent  anastomosis to the LAD at the junction of the proximal and mid third.  There was good flow to the LAD and the apex where it bifurcated.  The  LAD was somewhat underfilled, but no significant stenosis was seen in a  moderate-sized vessel.   Abdominal angiogram demonstrated a widely patent celiac with no  significant stenosis visualized.  The left SMA proximal stent showed 40-  50% eccentric smooth narrowing in the proximal half of the 6-mm stented  vessel and less than 10% narrowing in the distal half of the stent.  There was excellent flow throughout the SMA and no ostial stenosis was  seen.   The left renal artery was single with no significant stenosis.   The right renal artery was previously stented, had 40% smooth narrowing  with excellent flow.   The infrarenal abdominal aorta before the ABFG had 50-60% narrowing, but  relatively good residual luminal flow.  The Gore-Tex aortobifemoral  bypass graft was widely patent throughout.  The right common iliac was  occluded.  There was retrograde flow to the left external iliac through  the graft at the femoral region.  Previously placed fem-fem bypass graft  was not visualized and was known to be occluded.   The right profunda was intact to light SFA was occluded in its proximal  portion with no antegrade flow, but we did not try to visualize size  this beyond the proximal third of the thigh.   The left lower extremity angiogram revealed a widely patent left   profunda, widely patent SFA throughout its course with no significant  disease.  There was approximately 80%  and 70% narrowing of the left  peroneal.  The LAD and LPT were widely patent and visualized to the  foot.  There was three-vessel runoff to the left foot with no  significant obstructive disease.   DISCUSSION:  The patient's history is as outlined above.  Her coronary  anatomy is essentially unchanged from her most recent followup angiogram  of February 28, 2005 that showed a patent LIMA to the LAD, patent circumflex,  proximal mid stents and patent SVG to the PDA.  EF is about 40-45% with  wall motion abnormalities.   The left subclavian is occluded in the left remote.  Left carotid  subclavian bypass is intact and widely patent.  As far as her chest pain  is concerned, I would recommend continued medical therapy and  reassurance of this, continued secondary prevention and lipid with lipid  lowering, and ancillary medical therapy.   The etiology of her abdominal pain is not clear.  She has a excellent  residual lumen in the stented SMA with good flow and the celiac axis was  widely patent (the IMA was sacrificed with an aortobifemoral graft).  I  think she has a history of gastroparesis and intermittent abdominal pain  may be necessary.   The patient has not had arrhythmias or overt CHF, but will be treated  for hypertension and LV dysfunction.   Her right renal artery stent is widely patent as well and left renal  artery is normal.  She has chronic right lower extremity claudication,  but very minimal on her level of activity and good three-vessel runoff  with patent SFA throughout on the left.   CATHETERIZATION DIAGNOSES:  1. Remote diaphragmatic myocardial infarction age 79 on birth control      pills, treated medically long term, Dr. Steward Drone and Dr.      Pearletha Furl. Alanda Amass too, progression of disease with right      coronary percutaneous coronary interventions in  1990s, and      subsequent medical therapy.  2. Recurrent angina with progression of disease and coronary artery      bypass graft x3, Dr. Donata Clay, September 2000.  3. Remote left carotid subclavian bypass for symptomatic left      subclavian artery occlusion, widely patent on previous and this      study.  4. Patent left internal mammary artery to left anterior descending,      patent saphenous vein graft to posterior descending artery,      occluded radial graft to circumflex, no change since prior studies  5. Proximal and mid, drug-eluting stent circumflex stenting, April      2004, widely patent on this study.  6. Right renal artery, bare-metal stent from April 2004.  7. Systemic hypertension, bilateral patent renal arteries.  8. Multiple right lower extremity femoropopliteal graft attempts and      subsequent left-to-right femoropopliteal bypass Dr. Hendricks Milo in      1980s.  9. Aortobifemoral bypass graft, Dr. Micah Noel, 1988, widely patent.      Right lower extremity stable claudication with three-vessel runoff,      left lower extremity.  10.History of gastritis, gastroparesis, prior Gastroenterology      evaluation.  11.Intermittent crampy abdominal pain, etiology undetermined.  12.Patent superior mesenteric artery, bare-metal 6-mm PROMUS blue      stent, Dr. Fredia Sorrow, 2007 for mesenteric ischemia.  13.Systemic hypertension.  14.Hyperlipidemia.  15.Remote cigarette abuse.  16.Chronic back pain under the care of Dr. Jillyn Hidden  Jones with lumbosacral      disk disease and significant.   The patient has been seen by Dr. Yetta Barre in the past for lumbosacral disk  disease.  She was felt to be a poor risk cardiac wise, overall I have  dealt clinically that she was suitable.   Now that we have assessed her angiographically.  I believed she is a  suitable cardiac risk for back surgery should this be deemed clinically  appropriate.      Richard A. Alanda Amass, M.D.  Electronically  Signed     RAW/MEDQ  D:  12/30/2008  T:  12/30/2008  Job:  [id number]   cc:   [facility]  Larina Earthly, M.D.  Shirley Friar, MD  Jodi Marble. Fredia Sorrow, M.D.  Kari Baars, M.D.  Dr. Clement Husbands  [facility]

## 2011-01-11 NOTE — Assessment & Plan Note (Signed)
OFFICE VISIT   Betty Floyd, Betty Floyd  DOB:  16-Jan-1933                                       07/11/2008  BMWUX#:[id number]   The patient presents today for follow-up of her diffuse peripheral  vascular occlusive disease.  She has had multiple prior surgeries.  She  does have a known prior history of carotid disease and lower extremity  arterial insufficiency.  Surgery I believe back into the early 1980s  with Dr. Hendricks Milo.  She does have a known occluded right fem-pop  bypass.  She reports stable right leg claudication.  She does walk with  a walker due to diminished overall stability.  She denies any increasing  neurologic deficits.  She does have treatment for hypertension, elevated  cholesterol and has had a prior myocardial infarction.  She does not  smoke, having quit 20 years ago.  Does not drink alcohol on a regular  basis.  She does have a prior stent in her right kidney and does have  degenerative disk disease in her back.  She has multiple medications,  these are listed in her chart.  She is allergic to Vicodin and  Phenergan.   PHYSICAL EXAMINATION:  Vital Signs:  Blood pressure today is 97/58,  pulse 69, respirations 18.  General:  She is a well-developed, well-  nourished white female appearing stated age 77.  Her left neck incision  is well-healed.  She has no bruits bilaterally.  She has palpable  dorsalis pedis pulse on the left and no palpable pulses on the right.   She underwent noninvasive vascular laboratory studies in our office and  this reveals wide patency of her left endarterectomy.  She does have a  moderate 40-60% stenosis in her right internal carotid.  She has stable  ankle arm indices compared to her last study in our office 2 years ago,  she is 0.49 on the right and 0.99 on the left.  I reviewed these studies  with the patient and her daughter present.  I have recommended that we  see her on a yearly basis with continued  ultrasound evaluation.  She  will notify us should she develop any difficulty in the interim.   Larina Earthly, M.D.  Electronically Signed   TFE/MEDQ  D:  07/11/2008  T:  07/14/2008  Job:  [id number]   cc:   Kari Baars, M.D.  Richard A. Alanda Amass, M.D.

## 2011-01-11 NOTE — Consult Note (Signed)
Betty Floyd, Betty Floyd             ACCOUNT NO.:  [id number]   MEDICAL RECORD NO.:  [id number]          PATIENT TYPE:  INP   LOCATION:  1403                         FACILITY:  [facility]   PHYSICIAN:  Antonietta Breach, M.D.  DATE OF BIRTH:  02-Jan-1933   DATE OF CONSULTATION:  01/16/2007  DATE OF DISCHARGE:  01/17/2007                                 CONSULTATION   [name] has improved mood.  She has a pleasant social smile.  She  is enjoying company in the room.  She reports interests in living and  constructive goals.  Her appetite has improved.  She is not having any  adverse medication effects.   MENTAL STATUS EXAM:  She is alert.  Her affect is mildly constricted at  baseline but with a broad appropriate response.  Her mood is mildly  depressed.  Thought process is logical, coherent, goal-directed.  No  looseness of association.  Thought content:  No thoughts of harming  herself, no thoughts of harming others, no delusions, no hallucinations.  Concentration is improved over yesterday.  Her judgment is intact.  She  is oriented to all spheres.  Her memory is intact to immediate, recent,  and remote.   ASSESSMENT:  AXIS I:  1. (293.83) Mood disorder not otherwise specified, depressed      (functional and general medical factors), improved.  2. (296.33) Major depressive disorder recurrent.  3. (293.84) Anxiety disorder not otherwise specified, stable.   RECOMMENDATIONS:  Would continue the patient's psychotropic medication  with Cymbalta 30 mg daily for 1 week and then increase to 60 mg daily.  The goal will be to eliminate her need for Xanax.   Outpatient psychiatric followup can be found at one of the clinics  attached to [facility], [place], or [facility].   Cognitive behavioral therapy if practical for the patient along with  progressive muscle relaxation and deep breathing training could help her  eliminate Xanax use as well as provide additional  antidepressant therapy  along with the Cymbalta.      Antonietta Breach, M.D.  Electronically Signed     JW/MEDQ  D:  02/03/2007  T:  02/04/2007  Job:  [id number]

## 2011-01-11 NOTE — H&P (Signed)
NAMEMARGI, EDMUNDSON NO.:  [id number]   MEDICAL RECORD NO.:  [id number]          PATIENT TYPE:  EMS   LOCATION:  ED                           FACILITY:  [facility]   PHYSICIAN:  Gwen Pounds, MD       DATE OF BIRTH:  April 30, 1933   DATE OF ADMISSION:  01/12/2007  DATE OF DISCHARGE:                              HISTORY & PHYSICAL   PRIMARY CARE PHYSICIAN:  Dr. Sherryll Burger.   GASTROENTEROLOGIST:  Dr. Alycia Rossetti at [facility] GI  department.   CARDIOLOGIST:  Richard A. Alanda Amass, M.D.   CVTS SPECIALIST:  Larina Earthly, M.D.   ORTHOPEDIST:  Veverly Fells. Ophelia Charter, M.D.   GASTROENTEROLOGIST:  Petra Kuba, M.D.   UROLOGIST:  [place] [facility], M.D.   CHIEF COMPLAINT:  Confusion, failure to thrive, questionable UTI, and  depression.   HISTORY OF PRESENT ILLNESS:  This 77 year old female with known  hypotonic bladder and multiple urinary tract infections as well as other  multiple medical conditions, presents with two months of dizziness,  forgetfulness, failure to thrive.  Does not get out of bed.  This is  worse since her daughter moved out.  Depression, for which Dr. Sherryll Burger has  been working with her, Zoloft, and the family is thinking that this  medicine is actually making things worse, not better.  Patient has very  poor motivation.  She has no fatigue.  She has got no desire.  She is  emotionless.  She prefers to just being left alone in bed and does not  care about any potential consequences, not doing anything.  The family  forced her to go out shopping last weekend, where she got out and about  and did not really seem to enjoy herself.  She has to be reminded to  eat.  Her daughter did move out but is still close by the house and  makes sure she takes her medicines and makes sure she gets food.  Her  legs and body are getting very weak.  Between yesterday and today, she  has been having difficulty answering the phone, difficulty recognizing  family members,  remembering names, and increasing confusion.  She comes  to the ED, having laboratory data, which is pretty much unremarkable.  Urinalysis is equivocal.  The cranial CT, which was negative.  I was  called for inpatient admission, and I will admit her for evaluation and  treatment.  The only positive review of systems is that she has been  having off-and-on headaches.   PAST MEDICAL HISTORY:  1. Coronary artery disease, status post myocardial infarction and a      bypass surgery in 2000.  2. Peripheral vascular disease, status post multiple bypasses.  3. Bowel obstruction, status post resection.  4. Hyperlipidemia, status post bilateral carotid endarterectomy.  5. Chronic nausea, gastroparesis.  6. Hypotonic bladder.  7. SMA stent, formerly self-catheterizes for her hypotonic bladder.      Has not needed to.  8. Cholecystectomy.  9. Appendectomy.   ALLERGIES:  VICODIN, DYE, PHENERGAN.   MEDICATIONS LIST:  1. Aspirin 81 daily.  2. Clidinium 4 times per day.  3. Calcium carbonate 500 plus D twice daily.  4. Flexeril 10 mg as needed but rarely takes this.  5. Darvocet-N 100 1 tablet 4 times per day.  6. Imdur 30 mg per day.  7. Lasix 20 daily.  8. Lipitor 20 daily.  9. Lopressor 100 daily.  10.Plavix 75 daily.  11.Tricor 145 daily.  12.Pletal 100 b.i.d.  13.Protonix 40 b.i.d., rarely uses.  14.Reglan 5 with meals and for nausea.  15.Restoril 15-30 mg at bedtime.  16.Xanax 1 mg t.i.d.  17.Zoloft 50 mg per day.   REVIEW OF SYSTEMS:  She denies any fevers or chills.  She denies any  oropharyngeal-type issues except her mouth is dry.  She denies any chest  pain or shortness of breath.  She denies any new abdominal issues.  She  definitely has gait issues and headaches.  No difficulty with her bowel  or bladder at this moment.  All other organ systems reviewed and  negative.   PHYSICAL EXAMINATION:  VITAL SIGNS:  Temperature 98.3, blood pressure  127/44, heart rate 70,  respiratory rate 20.  Satting at 91-98% on room  air.  GENERAL:  She is alert.  She has no emotions.  She has a very flat  affect.  Oropharynx is dry.  PULMONARY:  Clear to auscultation bilaterally.  CARDIAC:  Regular.  A sternotomy noted.  SKIN:  Warm.  ABDOMEN:  Soft.  Multiple abdominal scars noted.  EXTREMITIES:  Venous insufficiency with stasis and multiple lower  extremity scarring noted.  No edema noted today.   ANCILLARY DATA:  White count 6.5, hemoglobin 12.5, platelet count 231,  54% segs.  Urinalysis:  Small leukocytes, 0-2 white blood cells but many  bacteria are noted, and this was an in-and-out cath, which may represent  colonization versus a true urinary tract infection.  Sodium 147,  potassium 3.1, chloride 104, bicarb 32, BUN 15, creatinine 1.2, glucose  92, TSH 1.269.   Cranial CT shows no cranial CT evidence of stroke.  No bleed was noted.  Minimal white matter changes was noted.   ASSESSMENT:  This is an elderly female with a possible urinary tract  infection, being admitted with confusion, failure to thrive, and major  depression.   PLAN:  1. Admit.  2. Antibiotics for at least seven days for the urinary tract infection      that I perceived to be there.  3. Will follow up on blood and urine cultures.  4. Will follow up on CK level.  5. Will follow up on other blood work.  6. Will give IV fluids for the mild dehydration and then proceed.  7. Will hold on Foley catheter with her urinary and bladder issues in      the past.  8. Will check a chest x-ray for safety.  9. Will back down on her current medications.  Will discontinue her      Zoloft.  Her family has asked me specifically to do this, and I      agree.  10.Will also decrease the amount of Xanax that she is using.  Will      also decrease the amount of Librium that she is using, knowing that     she is using this for her stomach, but I bed this is having a      negative aspect on her mood.  Also,  will decrease her Darvocet.  11.Will consider assisted living.  I do  not see how she can continue      living under her current conditions.  I actually do not call what      she is doing living.  It is just persisting, and barely at that.      She is clearly a massively failure to thrive.  12.We will obtain a psych consult.  I have already left a message for      Dr. Jeanie Sewer.  I expect her to be in over the weekend, so Dr.      Providence Crosby consult can happen on Monday.  13.Also, will consider a neurologic consult.  It is felt that  there      is a neurologic aspect to her symptoms.  14.Will get PT, OT, and case management involved.  15.We will get a nutrition consult to make sure she is getting enough      calories.  16.As far as her gait instability, this is probably from atrophy from      non-use, due to lying in bed all of the time.  Will check an      MRA/MRI in case there is a stroke, but I believe that she just      needs to work on getting towards      rehab.  17.She has already been given potassium to replete this.  18.We will place on Lovenox for DVT prophylaxis.      Gwen Pounds, MD  Electronically Signed     JMR/MEDQ  D:  01/12/2007  T:  01/12/2007  Job:  [id number]   cc:   [place] Service, M.D.  Fax: [phone]   Petra Kuba, M.D.  Fax: [phone]   Veverly Fells. Ophelia Charter, M.D.  Fax: [phone]   Larina Earthly, M.D.  [address]  [place]  [place] [address]   Richard A. Alanda Amass, M.D.  Fax: [phone]   Dr. Sherryll Burger

## 2011-01-11 NOTE — Procedures (Signed)
DUPLEX DEEP VENOUS EXAM - LOWER EXTREMITY   INDICATION:  Followup right lower extremity pain.   HISTORY:  Edema:  Right lower extremity  Trauma/Surgery:  Occluded right lower extremity bypass graft, femoral-  ATA  Pain:  Right lower extremity pain for 1-2 months  PE:  No  Previous DVT:  Unsure  Anticoagulants:  Plavix  Other:   DUPLEX EXAM:                CFV   SFV   PopV  PTV    GSV                R  L  R  L  R  L  R   L  R  L  Thrombosis    o  o  o     o     o  Spontaneous   +  +  +     +     +  Phasic        +  +  +     +     +  Augmentation  +  +  +     +     +  Compressible  +  +  +     +     +  Competent     +  +  +     +     +   Legend:  + - yes  o - no  p - partial  D - decreased   IMPRESSION:  1. No evidence of deep vein thrombosis in the right lower extremity or      left common femoral artery vein.  2. Evidence of numerous occluded (known) right lower extremity bypass      grafts which create excessive acoustic shadowing causing some areas      to be technically difficult to visualize.         _____________________________  Larina Earthly, M.D.   AS/MEDQ  D:  04/16/2009  T:  04/16/2009  Job:  [id number]

## 2011-01-11 NOTE — Op Note (Signed)
Betty, Floyd             ACCOUNT NO.:  [id number]   MEDICAL RECORD NO.:  [id number]          PATIENT TYPE:  INP   LOCATION:  4728                         FACILITY:  [facility]   PHYSICIAN:  Bernette Redbird, M.D.   DATE OF BIRTH:  03-05-1933   DATE OF PROCEDURE:  12/31/2008  DATE OF DISCHARGE:                               OPERATIVE REPORT   PROCEDURE:  Upper endoscopy.   INDICATIONS:  A 77 year old with chronic nausea in the setting of  extensive peripheral vascular disease and coronary disease, admitted to  the hospital several days ago with noncardiac chest pain.   FINDINGS:  Bile reflux with gastric erythema suggestive of bile reflux  gastritis.   PROCEDURE IN DETAIL:  The nature, purpose, and risks of the procedure  had been reviewed with the patient and her daughter and son, and she was  agreeable to proceed, provided written consent, and was brought in a  fasted state from her hospital room to the endoscopy unit.  Sedation was  fentanyl 25 mcg and Versed 5 mg IV without arrhythmias or clinical  instability.  The Pentax video endoscope was passed under direct vision.  The larynx looked grossly normal.  The esophagus was entered without  significant difficulty and had entirely normal mucosa without evidence  of any hiatal hernia or reflux occurring, nor any reflux esophagitis,  Barrett esophagus, varices infection, neoplasia, ring, or stricture.   The stomach contained a small bilious residual, with bile coating the  entire stomach.  There was quite a bit of diffuse gastric erythema with  a little bit of mosaic pattern to the gastric mucosa.  No erosions,  ulcers, polyps, masses, or vascular ectasia were noted.  There was no  endoscopic evidence of ischemia.  A retroflex view of the cardia was  unremarkable.  The pylorus, duodenal bulb, and second duodenum looked  entirely normal.   The scope was removed from the patient.  No biopsies were obtained.  She  tolerated the  procedure well and there were no apparent complications.   IMPRESSION:  1. Bile reflux, suggestive of gastric dysmotility.  The patient does      have a documented history of gastroparesis.  2. Gastric erythema suggestive of bile reflux gastritis.   PLAN:  Trial of sucralfate to see if this might help treat the patient's  nausea and improve her symptoms by bile salt binding.           ______________________________  Bernette Redbird, M.D.     RB/MEDQ  D:  12/31/2008  T:  01/01/2009  Job:  [id number]   cc:   Petra Kuba, M.D.

## 2011-01-11 NOTE — Discharge Summary (Signed)
NAMEHAIZEL, GATCHELL             ACCOUNT NO.:  [id number]   MEDICAL RECORD NO.:  [id number]          PATIENT TYPE:  INP   LOCATION:  4728                         FACILITY:  [facility]   PHYSICIAN:  Richard A. Alanda Amass, M.D.DATE OF BIRTH:  [birth year]   DATE OF ADMISSION:  12/27/2008  DATE OF DISCHARGE:  01/06/2009                               DISCHARGE SUMMARY   Ms. Villescas was taken off her oxycodone.  We put her on OxyContin 10 mg  b.i.d.  I instructed her that she should not be taking oxycodone if she  is going to take the OxyContin which showed be easier for her to take  and probably handle her chronic pain better.  We also changed her  metoprolol succinate to metoprolol tartrate 25 mg b.i.d.      Abelino Derrick, P.A.      Richard A. Alanda Amass, M.D.  Electronically Signed    LKK/MEDQ  D:  01/06/2009  T:  01/07/2009  Job:  [id number]

## 2011-01-11 NOTE — Assessment & Plan Note (Signed)
OFFICE VISIT   Betty Floyd, Betty Floyd  DOB:  26-May-1933                                       04/27/2009  UJWJX#:[id number]   This is a patient who went to the emergency department a few days ago  with pain in her right fifth toe.  She was evaluated by the ER physician  and referred here for further evaluation.  She has a long complicated  vascular surgery history outlined by Dr. Arbie Cookey in his note in November,  2009.  She had stable right leg claudication at that time and previously  had undergone a right femoral-popliteal bypass graft by Dr. Orland Mustard many years ago, which is known to be occluded.  Her ABI at that  point was 0.49.   Two days ago, she began having some pain in her right fifth toe, which  she describes as severe.  This was not associated with any trauma.  She  is able to ambulate, at least to the parking lot, without severe pain in  her legs.  She has no pain in the remaining toes of the right foot and  has had no bluish discoloration or other abnormal appearance, according  to the daughter.  She has been taking OxyContin, as prescribed by Dr.  Clelia Croft, as well as oxycodone, which was prescribed by the emergency room  physician 2 days ago.   PHYSICAL EXAMINATION:  Blood pressure 115/56, heart rate 77,  respirations 14.  Her carotid pulses are 3+ with no bruits.  Abdomen is  soft, nontender with no masses.  She has 3+ femoral and 2+ popliteal  pulse on the left with no distal pulses.  Right leg has a 2+ right  femoral pulses but no distal pulses.  There is no evidence of any bluish  discoloration, gangrene, infection, or other abnormalities in any of the  toes of either foot.  Left fifth toe is tender to touch.  It is unclear  what is causing the tenderness on physical exam.   ABIs in the office today revealed 0.32 on the right and 0.79 on the  left, both down slightly from November of last year.   I had a long discussion with the daughter  and the patient about this.  She has had saphenous vein removed from both legs and will not be a good  candidate for a redo bypass grafting, which previously was to the  anterior tibial artery on the right.  It is not clear to me that her  pain is coming from her poor circulation, particularly the lack of  physical findings.  I have recommended that they continue to treat this  with pain medication and return in 6 weeks to be seen by Dr. Arbie Cookey at  that time with repeat ABIs.  If the symptoms worsen in the interim, they  will be in touch with [place] for further evaluation.   Quita Skye Hart Rochester, M.D.  Electronically Signed   JDL/MEDQ  D:  04/27/2009  T:  04/27/2009  Job:  [id number]

## 2011-01-11 NOTE — Consult Note (Signed)
Betty Floyd, Betty Floyd NO.:  [id number]   MEDICAL RECORD NO.:  [id number]          PATIENT TYPE:  INP   LOCATION:  2918                         FACILITY:  [facility]   PHYSICIAN:  Bernette Redbird, M.D.   DATE OF BIRTH:  June 19, 1933   DATE OF CONSULTATION:  12/30/2008  DATE OF DISCHARGE:                                 CONSULTATION   Dr. Alanda Amass asked [place] to see this 77 year old patient because of  multiple GI symptoms.   Betty Floyd was admitted to the hospital several days ago with chest  pain, which fortunately turned out to be noncardiac in origin.  She has  a history of severe coronary and peripheral vascular disease, with her  first heart attack at age of 77, CABG in 2000, aortobifemoral bypass  grafting a number of years ago, and more recently, stenting of the  superior mesenteric artery because of mesenteric ischemia about 4 years  ago.   On top of her presenting symptom of chest pain, the patient has a  history of documented gastroparesis with a gastric emptying scan in 2006  showing over 50% retention at 2 hours, a history of small bowel  obstruction in 2004, and necessitating operative lysis of adhesions,  reflux disease, chronic right lower quadrant pain, nausea made worse by  meals on an intermittent basis, and constipation predominant,  irregularity of bowel habit with periodic diarrhea, which weaken her.  Despite all of this, not with much of the history being obtained from  the patient's daughter at the bedside, it sounds like the patient is  sort of getting along, living at home with the help of an in-home aid,  but really not very active, not even getting dressed many days, able to  stir around and make herself a few meals, but not really go to shopping.  It does not sound as though her GI symptoms have progressed  substantially over the past year nor has there been significant weight  loss during that period of time.   PAST MEDICAL HISTORY:  She is  reported to have an allergy to CONTRAST  MEDIA, ALTACE, DEFEROXAMINE, PHENERGAN (tachycardia) and VICODIN.   OPERATIONS:  CABG in 2000, cholecystectomy, appendectomy and multiple  peripheral vascular procedures including aorto femoral bypass in 1998,  also carotid and subclavian bypass operations.   MEDICAL ILLNESSES:  Coronary artery disease, peripheral vascular  disease, hyperlipidemia, depression, gastroparesis, and history of  mesenteric ischemia.   Outpatient medications are numerous and include Tramadol Xanax, calcium,  Protonix, Plavix, Cymbalta, TriCor, temazepam, Metamucil, Align, Colace,  KCL supplement, Lipitor, metoprolol, aspirin, fish oil, Amitiza, and  MiraLax.   HABITS:  The patient is a former smoker and nondrinker.   FAMILY HISTORY:  Not obtained, probably not relevant to current  presentation.   SOCIAL HISTORY:  Please see HPI.  The patient worked in [employer]  locally for a number of years.   PHYSICAL EXAMINATION:  GENERAL:  This is an elderly frail appearing  Caucasian female in no acute distress.  HEENT:  She is anicteric.  She is without frank pallor.  CHEST:  Clear anteriorly.  HEART:  Normal, without arrhythmia or murmur.  ABDOMEN:  Without significant tenderness, but there is tympani in the  right lower quadrant.   IMAGING:  The patient underwent angiography today, which showed no  critical stenosis of her coronary vessels, her SMA stent, or her lower  extremity bypasses, per conversation with Dr. Alanda Amass.  She had a CT  of the abdomen and pelvis 2 days ago that showed some diverticulosis  without acute inflammatory changes.   LABORATORY DATA:  White count 9,700, hemoglobin 11.1, and platelets  177,000.  INR 1.1.  Chemistry profile pertinent for admission.  Albumin  of 3.3.  Normal liver chemistries and a current BUN of 16 and creatinine  of 0.96.   IMPRESSION:  1. Presenting symptom of noncardiac chest pain, raising the question      of an  attack of gastroesophageal reflux disease or possibly      esophageal spasm versus GI causes.  2. History of gastroesophageal reflux disease, on high-dose Protonix      as outpatient.  3. Documented gastroparesis, etiology unclear since there is no      history of diabetes,? secondary to ischemia.  4. History of previous documented mesenteric vascular insufficiency      with colonoscopy in 2006 showing ischemic cecitis, now status post      successful stenting with       on current angiography.  5. Chronic nausea symptoms, etiology unclear, wonder about the      possibility of bile reflux versus the gastroparesis.  6. Irregularity of bowel habit with constipation predominance, but      periodic diarrhea on multiple agents to prevent and control      constipation.  7. History of small bowel obstruction, status post adhesiolysis      surgery.   RECOMMENDATIONS:  I think a good place to start would be endoscopic  evaluation since it has been many years since the patient had that.  In  fact, at the moment, we do not have any specific prior record of an  endoscopy.  The nature, purpose, and risks of the procedure were  reviewed with the patient and she is agreeable.  I also discussed with  the patient and the patient's daughter and son at the bedside.  Depending on the endoscopic findings, I think the patient's interests  would be best served by attempts at optimization of her medical therapy  as an outpatient, keeping in mind that there was apparently a history of  intolerance to metoclopramide with parkinsonian reaction and depression  and weakness when it was previously used.  The patient has seen Dr.  Beverly Gust at [facility], who may have tried her transiently on  domperidone, but it does not sound as though either there was an  adequate trial or a marked benefit to that medication, the patient's  daughter is unclear on details.  It may not be possible to optimize the   patient's GI condition much beyond what it is and she is rather wary of  going to multiple physicians for multiple visits, so a case could be  made for not insisting on further GI followup at this time.           ______________________________  Bernette Redbird, M.D.     RB/MEDQ  D:  12/30/2008  T:  12/31/2008  Job:  [id number]   cc:   Kari Baars, M.D.  Richard A. Alanda Amass, M.D.  Petra Kuba, M.D.

## 2011-01-11 NOTE — Assessment & Plan Note (Signed)
OFFICE VISIT   Betty Floyd, Betty Floyd  DOB:  May 10, 1933                                       09/04/2009  EAVWU#:[id number]   The patient presents today with her daughter for evaluation of  peripheral vascular occlusive disease.  Her medical records were  reviewed from recent admission in December.  She was admitted with  generalized weakness and unresponsiveness that in all likelihood was  related to an inadvertent double dosing of multiple narcotic and Valium  and Ativan prescriptions.  She continues to have failing health overall.  I had seen her in the past for lower extremity claudication issues and  also some early tissue loss in the right foot and fortunately this has  all remained stable.  She does not have any focal neurologic deficits.   PAST MEDICAL HISTORY:  Significant for coronary artery bypass grafting  in 2000.  She also has history of hypertension, elevated cholesterol,  prior myocardial infarction and also prior bowel resection.   FAMILY HISTORY:  She has a strong family history of premature  atherosclerotic disease in her mother, father and siblings.   SOCIAL HISTORY:  She is single, retired, 2 children.  She does not  smoke, having quit 20 years ago.  She does not drink alcohol.   REVIEW OF SYSTEMS:  Positive for some weight gain, up to 157 pounds.  She has multiple positive review of systems with shortness of breath,  reflux, hiatal hernia, diarrhea alternating with constipation, urinary  frequency and dysuria, pain with walking, history of dizziness,  headaches, arthritic joint pain, depression, anxiety and hearing  changes.   PHYSICAL EXAMINATION:  She is a frail-appearing white female appearing  stated age of 4.  Her blood pressure is 108/67, pulse 72, respirations  18.  She is atraumatic, normocephalic.  I do not hear any carotid  bruits.  Chest:  Clear bilaterally.  Heart:  Regular rate and rhythm.  Abdomen:  Benign with no  masses or tenderness.  She has no focal  neurologic deficits.   I have reviewed her MRA and duplex with her.  This does not show any  significant change.  She does have asymptomatic carotid stenosis.  I  feel that she is extremely high risk for any intervention and certainly  would not recommend any treatment unless she had clear focal right brain  deficits.  The patient and her daughter understand this and will  continue with yearly carotid duplex.     Larina Earthly, M.D.  Electronically Signed   TFE/MEDQ  D:  09/04/2009  T:  09/07/2009  Job:  [id number]   cc:   Kari Baars, M.D.  Richard A. Alanda Amass, M.D.

## 2011-01-14 NOTE — Consult Note (Signed)
Betty Floyd, Betty Floyd             ACCOUNT NO.:  [id number]   MEDICAL RECORD NO.:  [id number]          PATIENT TYPE:  INP   LOCATION:  0103                         FACILITY:  [facility]   PHYSICIAN:  Betty Floyd, M.D.  DATE OF BIRTH:  [birth year]   DATE OF CONSULTATION:  01/10/2006  DATE OF DISCHARGE:                                   CONSULTATION   CHIEF COMPLAINT:  Abdominal pain.   HISTORY OF PRESENT ILLNESS:  Betty Floyd is a 77 year old white female with  severe peripheral vascular disease, status post multiple bypass surgeries,  coronary artery disease status post CABG, and chronic recurrent abdominal  pain and gastroparesis who presented to the emergency department today with  severe abdominal pain for the past 12 hours.  The patient has had  intermittent episodes of abdominal pain for several years, and has undergone  extensive work-up by Dr. Ewing Schlein in the recent past.  She has been found to  have gastroparesis.  She had intermittent small bowel obstruction in October  2004.  She was concerned that her symptoms may be a recurrence of the small  bowel obstruction.  She states that about a week and a half ago she had a  severe episode of abdominal pain that lasted 3-4 days.  She spent most of  the day in bed.  Her symptoms resolved over the weekend, and she was feeling  well yesterday.  She actually worked in the garden, went to [employer] and  ate [employer] last evening for dinner around 8:30.  At around 11:30, she  developed severe right lower quadrant abdominal pain, which she describes as  aching.  She also developed epigastric pain, which radiates to her neck.  The pain persisted overnight and was associated with nausea and vomiting  this morning at about 4:30.  She had about 4 episodes of nausea and vomiting  following this and presented to the emergency department where her pain was  ultimately relieved with morphine.  She states that the pain is similar to  the pain she  has experienced in the past, but it is much more severe.  She  has had constipation recently with her last bowel movement about 2 days ago.  She states that she did stop her Baldemar Friday last week when she was not  feeling well.  She was actually scheduled to see Dr. Alycia Rossetti tomorrow at [facility] for further evaluation and  discussion of her chronic gastroparesis.   REVIEW OF SYSTEMS:  All systems were reviewed with the patient and are  negative, except in the HPI with the following exceptions:  With her recent  episode of abdominal pain, she also had headache, dizziness, and nausea.  The pain she has had in her chest today has been constant with no associated  shortness of breath.  Denies orthopnea.  She has intermittent right greater  than left leg pain with exertion; however, she oftentimes will have symptoms  that awaken her at night, described as aching in her calves and feet.  All  other systems are negative.   PAST  MEDICAL HISTORY:  1.  Coronary artery disease status post MI/CABG in 2000 (had a complicated      stent procedure in April 2004 by Dr. Alanda Floyd.  The most recent cardiac      catheterization was in July 2006.  Ejection fraction of 30-40%.  2.  Peripheral vascular disease, status post multiple bypass surgeries      including left carotid, subclavian, aortobifemoral in 1998, left carotid      endarterectomy.  Has evidence of persistent or recurrent lower extremity      claudication with abnormal ABIs on the right greater than the left.  3.  History of small bowel obstruction (October 2004).  4.  Hyperlipidemia.  5.  Gastroparesis with gastric emptying study in August of 2006 showing 82%      retention at 60 minutes and 54% retention at 120 minutes.  6.  Recurrent abdominal pain as above.  7.  Hypertonic bladder/recurrent urinary tract infections.  8.  Osteoporosis.  9.  Status post appendectomy.  10. Status post cholecystectomy.    MEDICATIONS:  1.  Lipitor 20 mg daily.  2.  Protonix 40 mg daily.  3.  Pletal 100 mg b.i.d.  4.  Lasix 20 mg daily.  5.  Plavix 75 mg daily.  6.  Alprazolam 5 mg p.r.n.  7.  Aspirin 81 mg daily.  8.  Darvocet-N 100 p.r.n.  9.  Tricor 145 mg daily.  10. Lopressor 100 mg daily.  11. Calcium.  12. Restoril 15 mg q.h.s. p.r.n.  13. Zoloft 50 mg daily.  14. Clidinium p.r.n.  15. Imdur 30 mg daily.  16. Reglan p.r.n.   ALLERGIES:  1.  VICODIN.  2.  IV DYE.  3.  PHENERGAN.   SOCIAL HISTORY:  She is divorced.  She has 1 son in [place] and 1 daughter,  Betty Floyd, who lives here in [place] and accompanies her to all of her  visits.  She has a 10th grade education, and is retired from her work at  [employer]  She has a 30 pack year smoking history, but quit more  than 15 years ago.   FAMILY HISTORY:  Her father died of heart disease.  Mother had diabetes and  heart disease.  She has 1 brother with coronary artery disease and another  brother with diabetes and a stroke.   PHYSICAL EXAMINATION:  VITAL SIGNS:  Temperature of 98.3, pulse 102,  respirations 18, blood pressure initially 163/61, now 114/58, oxygen  saturation 99% on room air.  GENERAL:  A pleasant female in no acute distress, resting comfortably.  HEENT:  No scleral icterus.  Pupils equal, round and reactive to light.  Extraocular movements intact.  Oropharynx is moist.  NECK:  Supple without lymphadenopathy or JVD.  She has no carotid bruits.  She does have a scar on the left.  HEART:  Regular rate and rhythm with a 2/6 systolic ejection murmur.  LUNGS:  Bibasilar crackles.  ABDOMEN:  Slightly distended with normoactive bowel sounds.  Does have  diffuse tenderness.  No rebound or guarding.  No masses appreciated.  EXTREMITIES:  No clubbing, cyanosis, or edema.  Dorsalis pedis pulses are 2+  and symmetric.  Extremities are warm. NEUROLOGIC:  Alert and oriented x4.  Nonfocal exam.   LABORATORY DATA:  Labs in  the emergency department included a CBC with a  white blood count of 11.3, hemoglobin 14.4, platelets 304.  BMET significant  for sodium of 139, potassium 3.9, chloride 101, bicarbonate 27,  BUN 19,  creatinine 1.2, glucose 126.  Albumin of 4.4.  Liver function tests normal.  Calcium 10.7.  Cardiac enzymes negative with a troponin of 0.02 and a CK of  31.  Amylase and lipase are normal at 76 and 25 respectively.  Urinalysis  shows 3-6 white blood cells and 0-2 red blood cells.   STUDIES:  1.  Acute abdominal series shows no acute cardiopulmonary disease.  No acute      abdominal disease with normal bowel gas pattern.  2.  CT of the abdomen and pelvis revealed no disease.  Postoperative changes      noted.  Sigmoid diverticulosis without diverticulitis.  No evidence of      colitis.   ASSESSMENT AND PLAN:  1.  Recurrent severe right lower quadrant abdominal pain.  I greatly      appreciate GI management of this patient.  I do agree with CT angiogram      to evaluate for mesenteric ischemia, given her severe peripheral      vascular disease.  I do not believe that this has ever been fully      evaluated.  Differential diagnosis does include chronic constipation, as      she has been off of her Baldemar Friday, as well as other forms of colitis      including irritation due to her antiplatelet therapy (Pletal, Plavix and      aspirin).  Continue supportive therapy with morphine and antiemetics.  2.  Chest pain.  This is likely gastrointestinal in etiology, although she      does have significant coronary disease.  The prolonged nature and      negative cardiac enzymes at this point suggest a non-cardiac etiology.      Will obtain an electrocardiogram and rule out myocardial infarction with      serial enzymes.  Continue medical therapy for coronary disease including      aspirin, Plavix, Lipitor, Tricor and Imdur.  Follow up with Dr.      Alanda Floyd as an outpatient or inpatient evaluation if  chest pain recurs.  3.  Peripheral vascular disease with claudication symptoms.  This has been      followed by Dr. Arbie Cookey and Dr. Alanda Floyd.  Continue antiplatelet therapy.  4.  Anxiety.  Continue Xanax p.r.n.  5.  Fluids, electrolytes, and nutrition.  NPO per GI.  Continue IV fluid      hydration and monitor electrolytes.  6.  Deep vein thrombosis prophylaxis.  Hemoccult check stools and consider      Lovenox.  SCDs for now.      Betty Floyd, M.D.  Electronically Signed     WS/MEDQ  D:  01/10/2006  T:  01/11/2006  Job:  [id number]   cc:   Betty Friar, Betty Floyd  Fax: [phone]   Betty Floyd, M.D.  Fax: [phone]   Betty Floyd, M.D.  [address]  [place]  [place] [address]

## 2011-01-14 NOTE — Discharge Summary (Signed)
NAMENASHYA, GARLINGTON             ACCOUNT NO.:  [id number]   MEDICAL RECORD NO.:  [id number]          PATIENT TYPE:  INP   LOCATION:  3709                         FACILITY:  [facility]   PHYSICIAN:  Richard A. Alanda Amass, M.D.DATE OF BIRTH:  [birth year]   DATE OF ADMISSION:  02/25/2005  DATE OF DISCHARGE:  03/02/2005                                 DISCHARGE SUMMARY   Ms Betty Floyd is a 77 year old white female patient of Dr. Susa Griffins, who has severe peripheral vascular disease.  She had coronary  artery disease, hypertension, hyperlipidemia.  She came into the hospital  with complaints of chest pain.  It was decided to admit her, put her on IV  heparin, IV nitroglycerin, and catheterize her.  Her enzymes came back  negative.  She underwent cardiac catheterization on February 28, 2005.  She had  LIMA to her LAD was patient. SVG to RCA was patent, and RIMA to circumflex  was occluded.  This is old finding.  She had two stents placed to her native  circumflex, and both of these were patent. She had distal disease to  anastomosis of her graft; however, her EF was down to 30 to 40%, and she had  an inferior apical aneurysm which apparently is new since last  catheterization in 2004.  She had Angioseal and was ready for discharge the  following day; however, the patient was feeling weak. She has had  significant problems with nausea recently and has been worked up by Dr.  Ewing Schlein.  She does have an appointment to see him on this coming Thursday.   On March 02, 2005, her blood pressure is 108/60, heart rate 55, respirations  18, temperature 98.6, room air saturation 95%.  She was feeling much better,  and she was stable to go home.  She was seen by cardiac rehab.  She was  recommended for Phase II program; however, she has severe peripheral  vascular disease and thought she could not do that.   LABORATORY DATA:  Hemoglobin 11.1, hematocrit 32.7, WBC 7.1, platelets 206.  Sodium 139,  potassium 3.9, glucose 139, BUN 21, creatinine 1.1.  CK-MB and  troponin negative.  TSH was 1.011.   DISCHARGE MEDICATIONS:  1.  Lasix 20 mg one time per day.  2.  Protonix 40 mg two times per day.  3.  Lopressor 50 mg two times per day.  4.  Aspirin 81 mg one time per day.  5.  Valium 5 mg three times per day.  6.  Tri-Chlor 145 mg one time per day.  7.  Plavix 75 mg one time per day.  8.  Imdur 30 mg one time per day.  9.  Lipitor 20 mg one time per day.  10. Nitroglycerin under the tongue as needed for chest pain.  11. __________ 50 mg two times per day.   She will follow with Dr. Alanda Amass August 3 at 2 p.m. unless she has  problems with her groin, and then she knows to give [place] a call. She will  follow up with Dr. Ewing Schlein on Thursday as scheduled.  She  should not sit in a  tub for seven days because of her Angioseal.  She should do no strenuous  activity, pushing or pulling, or lifting x 5 days, and she should do not  extended walking for two days.   DISCHARGE DIAGNOSES:  1.  Chest pain, not ischemic related, status post cardiac catheterization      without any progressive coronary artery disease.  2.  History of coronary artery bypass grafting in September 2000 by Dr.      Kathlee Nations Trigt.  3.  History of two stents placed in circumflex artery in 2004.  4.  Noted decrease in ejection fraction down to 30 to 40% with inferior      apical aneurysm.  Previous catheterization in 2004 showed an ejection      fraction of 55%.  5.  Severe peripheral vascular disease with remote left carotid      endarterectomy, remote left common carotid subclavian bypass by Dr.      Edilia Bo.  Multiple right lower extremity bypass grafts by Dr. Edilia Bo,      now occluded.  She has an occluded left-to-right femoral bypass graft.      She had an aortobifemoral bypass graft by Dr. Olga Millers in 1999.  She does      have chronic claudication.  6.  Chronic claudication.  7.  Dyslipidemia.  8.   Hypertension.  9.  History of renal artery stenosis with renal artery stenting on the      right.       BB/MEDQ  D:  03/02/2005  T:  03/02/2005  Job:  [id number]   cc:   Olene Craven, M.D.  [address]  [address]  [place]  [place] [address]  Fax: [phone]

## 2011-01-14 NOTE — Consult Note (Signed)
NAME:  Betty Floyd, Betty Floyd                       ACCOUNT NO.:  [id number]   MEDICAL RECORD NO.:  [id number]                   PATIENT TYPE:  INP   LOCATION:  0155                                 FACILITY:  [facility]   PHYSICIAN:  Vikki Ports, M.D.         DATE OF BIRTH:  Jun 23, 1933   DATE OF CONSULTATION:  06/16/2003  DATE OF DISCHARGE:                                   CONSULTATION   REFERRING PHYSICIAN:  Jackie Plum, M.D.   REASON FOR CONSULTATION:  Abdominal pain, rule out bowel obstruction.   HISTORY OF PRESENT ILLNESS:  The patient is a 77 year old white female who  presented three days ago with an acute onset of abdominal pain, nausea, and  vomiting.  She was seen in the emergency room and underwent CAT scan which  showed focal dilated jejunal loops in the left upper quadrant.  Over the  next two days the patient continued to have some mild crampy abdominal pain  and significant emesis the day of consultation.  NG tube had been placed  this morning and approximately 2.5 liters of fluid were suctioned from the  stomach.  Following that the patient's pain and discomfort were markedly  improved.  The patient has had one previous episode similar to this earlier  this month.   PAST SURGICAL HISTORY:  1. Aortobifemoral bypass.  2. Coronary artery bypass grafting.  3. Cholecystectomy.   MEDICAL HISTORY:  1. Coronary artery disease.  2. Peripheral vascular disease.  3. Gastroesophageal reflux disease.  4. Peptic ulcer disease.  5. Constipation.  6. Hypertension.  7. Hyperlipidemia.   MEDICATIONS:  Plavix, Lipitor, Protonix, MiraLax, Colace, Lopressor, Elavil,  Trental, Ultram, Lasix, Tricor, and Imdur.   SOCIAL HISTORY:  Negative for tobacco or alcohol use.   PHYSICAL EXAMINATION:  GENERAL:  The patient is an age-appropriate white  female in no distress.  VITAL SIGNS:  Her temperature is 98.7, her blood pressure is 136/70, heart  rate is 90, and respirations  are 18.  HEENT:  Normocephalic, atraumatic.  Pupils equal, round, and reactive to  light; sclerae nonicteric.  NECK:  Supple and soft without thyromegaly.  LUNGS:  Clear to auscultation and percussion x2.  CARDIOVASCULAR:  Shows regular rate and rhythm.  There is a systolic  ejection murmur.  ABDOMEN:  Slightly protuberant but soft, completely nontender to palpation.  EXTREMITIES:  Show no clubbing, cyanosis, or edema.   IMPRESSION:  Small bowel obstruction.  X-rays today showed complete  resolution of dilated small bowel but the patient has significant NG output.  I will recommend a repeat CT scan today with contrast via the NG tube to  again rule out a bowel obstruction.  Will follow closely.   Thank you very much for the consult.  Vikki Ports, M.D.    [facility]/MEDQ  D:  06/16/2003  T:  06/16/2003  Job:  [id number]

## 2011-01-14 NOTE — Cardiovascular Report (Signed)
NAME:  Betty Floyd, Betty Floyd                       ACCOUNT NO.:  [id number]   MEDICAL RECORD NO.:  [id number]                   PATIENT TYPE:  OIB   LOCATION:  2039                                 FACILITY:  [facility]   PHYSICIAN:  Richard A. Alanda Amass, M.D.          DATE OF BIRTH:  05/24/1933   DATE OF PROCEDURE:  12/04/2002  DATE OF DISCHARGE:                              CARDIAC CATHETERIZATION   PERIPHERAL VASCULAR INTERVENTION   PROCEDURES PERFORMED:  1. Right renal artery selective angiogram.  2. Transstenotic gradient measurement, right renal artery.  3. Percutaneous transluminal angioplasty, high-grade calcific ostial right     renal artery stenosis, symptomatic with significant hypertension on     medical therapy and positive outpatient duplex, November 29, 2002.  4. Subsequent stent for suboptimal angioplasty, high-grade ostial right     renal artery stenosis.   CARDIOLOGIST:  Richard A. Alanda Amass, M.D.   DESCRIPTION OF PROCEDURE:  The patient had prior percutaneous coronary  intervention on December 04, 2002.  Because of difficult access it was felt  best to proceed with renal intervention through the previously placed 6  [place] short Daig sidearm sheath.  The patient has severe PVD and access  problems with prior ABFG.   Previously placed 6 [place] Daig sheath was in place in the right groin.  The  patient was anticoagulated with heparin for a prior PCI and was on Aggrastat  bolus infusion.  The ACT was 260 seconds.  Because of the downgoing right  renal artery, the patient's grafts and anatomy a diagnostic 5 French  crossover catheter was utilized for selective right renal angiography.  This  demonstrated high-grade calcific ostial 90% stenosis with an 80 mmHg  transstenotic gradient on pullback with the 0.014 guide wire across and  without catheter damping Inez Pilgrim).  The lesion was crossed with a 0.014  inch Cordis stabilizer wire.  The diagnostic catheter was exchanged for a  6  [place] IMA guide with the guide wire in place.  The lesion was then dilated  with a 4 mm x 1.5 mm Cordis Aviator balloon at eight atmospheres for 30  seconds.  There was some good balloon expansion after initial inflation  across the ostia.  The balloon was pulled back.  Injection showed suboptimal  result with elastic recoil and residual stenosis.  So the lesion was,  because of this and the fact that it was an ostial lesion, then stented  using exchange technique with a rapid exchange Cordis 6 mm x 12 mm Genesis  stent premapped on an Aviator balloon. It was positioned to cover the ostium  and protrude approximately 1-2 mm into the aorta to assure coverage.  The  stent was deployed at eight atmospheres for 10 seconds.  The patient had  some right flank discomfort with balloon inflation, probably related to  distention and the balloon was promptly deflated and pain resolved.  Scout  injections showed no dissection.  The  balloon was then pulled back slightly  to cover the ostia and deployed at 10-25.  There was still some minor  residual stenosis in the inferior portion so the balloon was pulled back  slightly further and dilated at 10 for 26.  There was good expansion at the  ostia although it did funnel down.  There was approximately 10% residual  narrowing at the ostia with good placement of the stent across covering the  ostial stenosis.  Excellent stent apposition, but no dissection and flow to  the adrenal artery and capsular artery postprocedure.   The dilatation system was then removed and guiding catheter removed.  The  final ACT was 258 seconds.   The patient tolerated this well.  Blood pressures at the beginning of her  procedure 180-190.  She was given 5 mg of Lopressor IV and prior to the  procedure had received intracoronary nitroglycerin.  At the end of the  procedure systolic pressure was 140-150 with sinus rhythm 75-80.  The  patient was taken to the holding area with  the sheath in place for ACT  measurement, subsequent sheath removal and pressure hemostasis.  She  tolerated the procedure well.   CATHETERIZATION DIAGNOSES:  1. Renovascular hypertension; normal creatinine 1.0, systemic hypertension     on multiple medications with high-grade angiographic right renal artery     stenosis and positive duplex.  2. Successful right renal artery percutaneous transluminal angioplasty and     subsequent stent for suboptimal balloon angioplasty result.  3. Severe peripheral artery disease; see catheterization report for details,     status post aortobifemoral graft.  4. Remote coronary artery bypass graft, September 2000.  5. Recent successful culprit lesion percutaneous transluminal coronary     angioplasty and stent, proximal and mid circumflex arteries, December 04, 2002.  6. Hyperlipidemia.  7. Remote smoker.  8. Remote left carotid angioplasty, Dr. Arbie Cookey.  9. Remote left carotid subclavian bypass, Dr. Edilia Bo, patent.  10.      Aortobifemoral graft, patent, Dr. Olga Millers, 1988.  11.      Occluded right superficial femoral artery, chronic.  12.      Patent left superficial femoral artery.  13.      Stable claudication, right lower extremity, with multiple past     femoropopliteal bypass graft and femorotibial bypass graft, Dr. Adele Dan, remotely; medical therapy.  14.      Gastritis and past history of prepyloric ulcer, asymptomatic on     long-term proton pump inhibitor therapy.                                                 Richard A. Alanda Amass, M.D.    RAW/MEDQ  D:  12/04/2002  T:  12/06/2002  Job:  [id number]   cc:   Olene Craven, M.D.  [address]  [address]  [place]  [place] [address]  Fax: [phone]   [facility]   Nanetta Batty, M.D.  [phone] [address]  [place]  [place] [address]  Fax: [phone]

## 2011-01-14 NOTE — H&P (Signed)
NAME:  CORNELL, GABER NO.:  [id number]   MEDICAL RECORD NO.:  [id number]                   PATIENT TYPE:  EMS   LOCATION:  ED                                   FACILITY:  [facility]   PHYSICIAN:  Hollice Espy, M.D.            DATE OF BIRTH:  09/05/1932   DATE OF ADMISSION:  06/13/2003  DATE OF DISCHARGE:                                HISTORY & PHYSICAL   PRIMARY CARE PHYSICIAN:  Olene Craven, M.D.   HISTORY OF PRESENT ILLNESS:  This is a 77 year old white female with a past  medical history of CAD status post stent placement and GERD who presents  with abdominal pain, and found to have a possible ileus.  The patient  actually was seen in the hospital a few weeks ago for similar symptoms of  abdominal pain, nausea and vomiting.  There are no records of this  transcribed and records are not available, but it is presumed that she had a  possible ileus at that time.  She presents with a greater than one-day  history of severe nausea, vomiting and abdominal pain.  She had been unable  to keep anything down prior to this day.  She was doing well with no  complaints.  She came into the ER and amylase and lipase were done, which  were normal.  CPK and MB were also normal.  She had a slightly elevated  glucose of 154; however, this was done in the evening.  LFTs were also  normal.  She had a white count of 10.6 with an 89% shift, H&H are stable,  and MCV normal as well.  The patient had an acute flat plate of the abdomen  done, which showed some suggestion of mildly thickened loops of small bowel  in the left abdomen.  CT with contrast is recommended.  The patient has an  allergy to IVP DYE and she was given barium.  She tried to drink as much as  she could; she ended up throwing this up; and, it was decided to go ahead  and do the CT anyway.  The patient CT shows the patient is status post  chole, status post aortobifemoral bifemoral bypass, focal  left upper  quadrant jejunal dilated loops, questionable closed loop obstruction versus  ileus.  The patient is also noted to have sigmoid diverticulosis and some  air in the bladder.   The patient currently complains of some abdominal pain, although she does  have some slight improvement than when she first came in.  She still  complains of some nausea.  She does, however, have a dry mouth and she is  thirsty.  She otherwise  denies any visual changes, hearing changes and  dysphagia.  She denies any chest pain, chest burning, palpitations, chest  heaviness, shortness of breath, wheeze, or cough.  She complains of some  abdominal fullness and tenderness.  She denies  any diarrhea or constipation.  She denies any extremity pain or weakness. She does feel tired over.   PAST MEDICAL HISTORY:  Past medical history is significant for:  1. CAD.  2. History of peripheral vascular disease status post aortobifemoral bypass.  3. Status post stent placement.  4. History of GERD.  5. The patient has history of erosive esophagitis.  6. History of prepyloric ulcer.  7. History of constipation.  8. History of hypertension.  9. History of hyperlipidemia.   MEDICATIONS:  The patient is on:  1. Plavix 75.  2. Lipitor 20.  3. Protonix 40.  4. Miralax 17 grams.  5. Colace 200.  6. Lopressor 75 mg.  7. Elavil 50.  8. Trental 40.  9. Ultram 50.  10.      Lasix 40.  11.      Tricor 160.  12.      Imdur 30.   ALLERGIES:  The patient is allergic to IVP DYE and she has a NO ASPIRIN  RECOMMENDATION as per a previous discharge summary.   SOCIAL HISTORY:  The patient does not currently actively drink or smoke.   FAMILY HISTORY:  Family history is noncontributory.   PHYSICAL EXAMINATION:  VITAL SIGNS:  Vitals on admission; temp 97.2, blood  pressure 132/66, heart rate 82, respirations 18, and O2 sat 95% on room air.  GENERAL:  This is an alert and oriented white female in mild distress, but   otherwise well.  HEENT:  Normocephalic and atraumatic.  Her mucous membranes are dry.  NECK:  The patient has no carotid bruits.  CARDIOVASCULAR:  Regular rate and rhythm, S1 and S2.  There is a 2/6  systolic ejection murmur.  LUNGS:  Clear to auscultation bilaterally.  ABDOMEN:  Abdomen is soft with some involuntary guarding.  She has mild  tenderness throughout.  She has no bowel sounds.  It is mildly distended.  She has no fluid wave.  EXTREMITIES:  Extremities show no clubbing, cyanosis or edema.   LABORATORY DATA:  X-ray and CT are as per HPI.  White count 10.6 with an 89%  shift, hemoglobin 14.5, hematocrit 42.3, MCV 96, and platelets 284,000.  Sodium 145, potassium 3.9, chloride 106, and bicarb 29.  No anion gap.  BUN  22, creatinine 1.2, glucose 154, and calcium 11.0.  LFTs all are within  normal limits.  Troponin I 0.02.  UA is negative.  Lipase is 29 and amylase  is 79, both of which are normal.   ASSESSMENT/PLAN:  1. Ileus versus small bowel obstruction.  Likely this is more likely ileus since the patient had previous symptoms a  few weeks ago and the cause of this is no exactly clear.   We will make the patient NPO, give IV fluids for rehydration as well, give  her some Phenergan for her nausea and morphine for pain.  We will put the  patient on IV Reglan as well to help.  She has a small amount of stool, but  for the most part this Reglan should help her ileus.  We will hold off on  most of her p.o. meds and can start those in the next day or so after her  ileus resolves.  We will give her IV Lopressor to control her blood pressure  as well.   I suspect that once, since this is her second occurrence in the last month,  she becomes p.o. and stable would discharge her on p.o. Reglan.  She may  need a GI workup, but more likely this can be done as an outpatient.  The  patient is otherwise stable.  1. Coronary artery disease.   We will continue Lopressor for her  hypertension, but will otherwise hold her  medications for one day.   1. Gastroesophageal reflux disease.   We will continue her IV Protonix.                                                 Hollice Espy, M.D.    SKK/MEDQ  D:  06/14/2003  T:  06/14/2003  Job:  [id number]   cc:   Olene Craven, M.D.  [address]  [address]  [place]  [place] [address]  Fax: [phone]

## 2011-01-14 NOTE — H&P (Signed)
NAMECHARLIE, CHAR NO.:  [id number]   MEDICAL RECORD NO.:  [id number]          PATIENT TYPE:  EMS   LOCATION:  ED                           [facility]ACILITY[facility]  [facility]   PHYSICIAN:  Shirley Friar, MDDATE OF BIRTH:  23-Oct-1932   DATE OF ADMISSION:  01/10/2006  DATE OF DISCHARGE:                                HISTORY & PHYSICAL   REQUESTING PHYSICIAN:  Dr. Donnetta Hutching.   REASON FOR ADMISSION:  Abdominal pain, vomiting.   HISTORY OF PRESENT ILLNESS:  Ms. Maskell is a 77 year old white female with  multiple medical problems as stated below including coronary artery disease,  congestive heart failure, peripheral vascular disease, who has undergone  multiple evaluations for abdominal pain and vomiting, who presents with  acute onset of worsening right lower quadrant abdominal pain, vomiting  multiple times, and dizziness.  She says that for the last several years she  has intermittent episodes where she will have a combination of dizziness,  shaking spells and right lower quadrant abdominal pain.  These spells will  last for 4 days and then the dizziness resolved, but the abdominal pain  persists.  She reports having another spell last night described as severe  right lower quadrant abdominal pain that was sharp and was followed by  multiple episodes of vomiting.  She does have a known history of  gastroparesis and has been on Reglan chronically for this.  She also has  chronic constipation and has been on multiple different medicines including  Amitiza and Kristalose without any significant relief.  She reports that she  was scheduled to see Dr. Alycia Rossetti at [facility]  tomorrow, but came into the ER today because the pain was too severe.  She  states that she last moved her bowels 3 or 4 days ago, when normally she can  move them everyday with medicines.  Currently, she is in no acute distress.  White blood count is 11.3 and afebrile.  She had an  x-ray done which is  concerning for thickening of her colon and therefore abdominal CT scan was  done and the preliminary findings on this showed no acute findings.  She  does have a history of small-bowel obstruction in October 2004, undergoing  exploratory laparotomy with lysis of adhesions.   PAST MEDICAL HISTORY:  1.  Coronary artery disease.  2.  CHF.  3.  Peripheral vascular disease.  4.  Chronic claudication.  5.  Hyperlipidemia.  6.  Hypertension.  7.  History of renal artery stenosis with renal artery stenting on the      right.  8.  Status post coronary artery bypass graft.   MEDICINES:  1.  Aspirin 81 mg daily.  2.  Cyclobenzaprine 10 mg p.r.n.  3.  Darvocet-N 100 one pill q.i.d. p.r.n.  4.  Xanax 1 mg t.i.d.  5.  Lipitor 20 mg daily.  6.  Lopressor 100 mg daily.  7.  Protonix 40 mg b.i.d.  8.  Lasix 20 mg daily.  9.  Imdur 30 mg daily.  10. Plavix 75 mg daily.  11. Pletal  100 mg b.i.d.  12. Restoril 30 mg p.r.n.  13. TriCor 145 mg daily.   ALLERGIES:  PHENERGAN and VICODIN.   FAMILY HISTORY:  Unable to obtain.   SOCIAL HISTORY:  Denies cigarettes or alcohol.   REVIEW OF SYSTEMS:  Negative, except as above.   PHYSICAL EXAMINATION:  VITAL SIGNS:  Temperature 98.3, pulse 98, blood  pressure 131/71.  O2 saturation is 96% on 2 L.  GENERAL:  Alert, in no acute distress.  HEENT:  Anicteric sclerae.  CHEST:  Clear to auscultation bilaterally.  CARDIOVASCULAR:  Regular rate and rhythm without murmurs.  ABDOMEN:  Severe right lower quadrant tenderness with guarding and  distention, otherwise mildly tender in other quadrants, positive bowel  sounds, no rebound, healed midline surgical scar.  EXTREMITIES:  No edema.   LABORATORY DATA:  White blood count 11.3, hemoglobin 14.4, platelet count  304,000, no bands; sodium 139, potassium 3.9, chloride 101, CO2 27, BUN 19,  creatinine 1.2, glucose 126, total bilirubin 0.9, ALP 30, AST 37, ALT 29,  albumin 4.4.   Urinalysis:  Cloudy, negative nitrite, trace leukocytes, many  bacteria, 3-6 white blood cells.   IMPRESSION:  77-year-old white female with right lower quadrant  abdominal pain, nausea and vomiting.  Recent CT scan done today is negative  for any acute findings, but the patient's pain and risk factors are  concerning for mesenteric ischemia.  She also has a history of gastroparesis  which could have led to her vomiting episode, but not her right lower  quadrant abdominal pain and distention.  She reportedly had a colonoscopy  done within the past couple of years, but the documentation from this  colonoscopy is not available at this time.  We will evaluate for mesenteric  ischemia and if this study is negative and the patient's pain improves, it  may be due to a combination of gastroenteritis and gastroparesis and  possible adhesions.  I doubt obstruction is an issue right now, since her  abdominal CT scan is negative for any dilated loops of small bowel or colon,  but she could have partial small-bowel obstructions causing these symptoms.  Even though she has an appointment tomorrow with Dr. Alycia Rossetti at [facility], I  feel that she is not stable enough to be discharged to go to that visit as  an outpatient and will have the  patient admitted here for further management at this time.  I will do a CT  angiogram to rule out mesenteric ischemia, put the patient on bowel rest and  start intravenous fluids.  I will also ask Dr. Eilleen Kempf team to help with  medical issues.      Shirley Friar, MD  Electronically Signed     VCS/MEDQ  D:  01/10/2006  T:  01/11/2006  Job:  [id number]   cc:   Kari Baars, M.D.  Fax: [phone]   Petra Kuba, M.D.  Fax: [phone]

## 2011-01-14 NOTE — Cardiovascular Report (Signed)
NAMEVERNIS, Betty Floyd             ACCOUNT NO.:  [id number]   MEDICAL RECORD NO.:  [id number]          PATIENT TYPE:  INP   LOCATION:  3709                         FACILITY:  [facility]   PHYSICIAN:  Madaline Savage, M.D.DATE OF BIRTH:  [birth year]   DATE OF PROCEDURE:  02/28/2005  DATE OF DISCHARGE:                              CARDIAC CATHETERIZATION   PROCEDURES PERFORMED:  1.  Selective coronary angiography of the native coronary circulation.  2.  Retrograde left heart catheterization.  3.  Left ventricular angiography.  4.  Selective visualization of the left internal mammary artery.  5.  Selective visualization of several saphenous vein grafts.  6.  Right percutaneous femoral AngioSeal.   ENTRY SITE:  Right femoral.   DYE USED:  Omnipaque.   PATIENT PROFILE:  The patient is a 77 year old patient of Dr. Alanda Amass who  previously underwent coronary artery bypass grafting with a saphenous vein  graft placed through a right coronary artery, a radial artery graft from  aorta to circumflex, internal mammary artery graft to LAD. The patient  admitted to the hospital with chest pain and has had negative cardiac  enzymes and is the cath lab today for restudy of her coronary circulation in  view of her recent symptoms.   RESULTS:  Left ventricular pressure was 165/5, end-diastolic pressure 24.  Central aortic pressure 162/65, mean of 100. No aortic valve gradient by  pullback technique.   ANGIOGRAPHIC RESULTS:  The left main coronary artery was patent. The LAD  coursed to the cardiac apex and there was high-grade lesions in the proximal  LAD. The circumflex was widely patent throughout and contained to radio-  opaque stents, the first near obtuse marginal branch #1, the second obtuse  marginal branch #2. There was distal flow into this vessel and it appeared  normal.   The right coronary artery contained an 85% stenosis in the midportion and  there was antegrade but competitive  flow into the distal RCA branches. There  was a patent saphenous vein graft to the RCA, widely patent throughout with  flow greater in the saphenous vein graft then from the native circulation  into the PDA and PLA.   Radial artery graft to circumflex completely occluded at the proximal  anastomotic site on the aorta with no antegrade flow.   Patent internal mammary artery graft to LAD with good distal runoff into the  LAD.   Left ventricular angiography showed ejection fraction estimate of 30-40%  with an inferoapical aneurysm.   RECOMMENDATIONS:  The patient should be treated with medical therapy of her  coronary disease. She has intact circulation to all three major coronary  arterial groups despite the occlusion of her radial artery vein graft to the  circumflex. This is because she has two patent stents deployed by Dr.  Alanda Amass in the year 2004. So continued medical therapy this patient.       WHG/MEDQ  D:  02/28/2005  T:  02/28/2005  Job:  [id number]   cc:   Olene Craven, M.D.  [address]  [address]  [place]  [place] [address]  Fax:  [phone]   Medical Records   Richard A. Alanda Amass, M.D.  [phone] [address], [address]  [place]  [place] [address]  Fax: [phone]   [facility]

## 2011-01-14 NOTE — Discharge Summary (Signed)
NAMEHAZELLE, WOOLLARD             ACCOUNT NO.:  [id number]   MEDICAL RECORD NO.:  [id number]          PATIENT TYPE:  INP   LOCATION:  4728                         FACILITY:  [facility]   PHYSICIAN:  Richard A. Alanda Amass, M.D.DATE OF BIRTH:  02-16-1933   DATE OF ADMISSION:  12/27/2008  DATE OF DISCHARGE:  01/06/2009                               DISCHARGE SUMMARY   DISCHARGE DIAGNOSES:  1. Coronary artery disease, not suspected to be cardiac in nature this      admission, after catheterization done revealed a patent graft to      the posterior descending artery, patent left internal mammary      artery to the left anterior descending, and patent circumflex      stents.  2. Known coronary artery disease with coronary artery bypass grafting      in 2000 and multiple interventions since.  3. Peripheral vascular disease with multiple procedures in the past,      pulmonary vein angiogram this admission showing patent left      superior mesenteric artery stent, patent right renal artery stent,      patent iliacs, and an old totaled right superficial femoral artery      with three-vessel runoff with an old occlusion of her left-to-right      femoral femoral bypass graft with patent aortobifemoral bypass      graft.  4. Chronic pain.  5. Treated hypertension.  6. Dyslipidemia.   HOSPITAL COURSE:  Ms. Arseneau is a 77 year old vasculopath, well known  to Dr. Alanda Amass.  She has had multiple coronary and peripheral vascular  procedures.  She has chronic back pain.  She is admitted on Dec 27, 2008,  with chest pain, worrisome for unstable angina.  She was admitted to  telemetry.  CK-MBs were obtained and were negative.  CT of abdomen and  pelvis showed no acute changes.  She was put on heparin on admission for  unstable angina.  She was seen by Dr. Alanda Amass and taken to the cath  lab on Dec 29, 2008.  Catheterization revealed patent SVG to the PDA, and  old occluded radial artery of the circumflex  with patent circumflex  stents, patent LIMA to the LAD with an EF of 40-45%.  Peripheral  angiogram revealed a patent SMA stent, a patent aortobifemoral bypass  grafting, patent renal artery stent on the right with an old occluded  proximal right SFA with 3-vessel runoff on the left.  She was seen in  consult by the GI Service, Dr. Matthias Hughs.  She was treated for gastritis  and gastroesophageal reflux.  Medications were adjusted.  She did not  tolerate Cozaar because of hypotension and this had to be discontinued.  Mobilization was a problem as the patient had chronic pain and could  hardly get out of bed.  She was requiring large doses of codeine.  We  changed her p.r.n. codeine to OxyContin b.i.d.  We felt she could be  discharged on Jan 06, 2009.   DISCHARGE MEDICATIONS:  1. TriCor 145 mg a day.  2. Xanax 0.5 mg b.i.d.  3. Cilostazol 100  mg b.i.d.  4. Cymbalta 60 mg a day.  5. Amitiza 8 mcg b.i.d.  6. Aspirin 81 mg a day.  7. Calcium carbonate 500 daily.  8. Cyclobenzaprine p.r.n.  9. Darvocet p.r.n.  10.Imdur 15 mg a day.  11.Lipitor 20 mg a day.  12.Plavix 75 mg a day.  13.Protonix 40 mg a day.  14.Restoril 15 mg at bedtime.  15.Carafate 1 g a.c. and h.s.  16.Ultram 25 mg at bedtime.  17.Metoprolol 25 mg twice a day.  18.Flora-Q daily.  19.OxyContin 10 mg twice a day.   LABORATORIES:  White count 7.4, hemoglobin 12.7, hematocrit 36.9, and  platelets 239.  INR is 1.0.  Sodium 136, potassium 4.9, BUN 30, and  creatinine 1.27.  CK-MB and troponins were negative.  Cholesterol is  106, HDL 28, and LDL 59.  Liver functions were normal.  CK-MB and  troponins were negative.  TSH 2.27.   DISPOSITION:  The patient is discharged in stable condition and will  follow up with Dr. Alanda Amass.      Abelino Derrick, P.A.      Richard A. Alanda Amass, M.D.  Electronically Signed    LKK/MEDQ  D:  02/11/2009  T:  02/12/2009  Job:  [id number]   cc:   Kari Baars, M.D.

## 2011-01-14 NOTE — Cardiovascular Report (Signed)
NAME:  Betty Floyd, Betty Floyd                       ACCOUNT NO.:  [id number]   MEDICAL RECORD NO.:  [id number]                   PATIENT TYPE:  OIB   LOCATION:  2039                                 FACILITY:  [facility]   PHYSICIAN:  Richard A. Alanda Amass, M.D.          DATE OF BIRTH:  Sep 12, 1932   DATE OF PROCEDURE:  12/04/2002  DATE OF DISCHARGE:                              CARDIAC CATHETERIZATION   PROCEDURES PERFORMED:  1. Retrograde central aortic catheterization.  2. Selective coronary angiography via Judkins technique.  3. Intracoronary nitroglycerin administration.  4. Percutaneous coronary intervention with Aggrastat bolus plus infusion,     weight adjusted heparin, p.o. Plavix and aspirin preoperatively.  5. Cutting balloon atherectomy, proximal circumflex, high-grade calcific     symptomatic stenosis with positive Cardiolite.  6. Subsequent DES stent.  7. Percutaneous transluminal coronary angioplasty high-grade segmental mid     calcific complex stenosis with subsequent stent.   CARDIOLOGIST:  Richard A. Alanda Amass, M.D.   BRIEF HISTORY:  Please refer to admission note and to previous  catheterization report on November 26, 2002.   The patient is 77 year old divorced mother of two and grandmother.  She is a  remote smoker.  She has a long history of coronary disease dating back to a  diagonal MI at age 78 in 77 on birth control pills.  Medically treated  long term.  Progression of disease prompting multivessel CABG by Dr. P.V.T.  September 2000, complex anatomy.  She has a prior left carotid subclavian  bypass.  She had a LIMA to the LAD, which is patent and with a totally  occluded left subclavian, but good flow on angiography.  She has remote LCA  by Dr. Arbie Cookey and then subsequent left carotid subclavian bypass Dr. Edilia Bo.   The patient has complex lower extremity abnormalities with multiple prior  fem-pop and fem-tib bypasses of the  right lower extremity, which failed, a  failed fem-fem crossover graft and subsequent aortobifem by Dr. Olga Millers  March 1988.  Treated medically with stable claudication  RLE, no significant  claudication LLE.  Past history of prepyloric ulcer, under the care of Dr.  Juanda Chance, remotely, stable on PPI, long term.  The patient was hospitalized at  [facility] for chest pain without myocardial infarction February 2004.  Treated  medically with outpatient Cardiolite on November 11, 2002 demonstrating lateral  ischemia, change from prior study of April 2003.  She was remotely on  Coumadin, has been on chronic Plavix therapy and underwent diagnostic  catheterization November 26, 2002.  She has associated significant hypertension  on medical therapy and hyperlipidemia.  The patient was found to have, as  mentioned, a patent LIMA to the LAD with patent diagonal branches that were  not jeopardized (50% smooth proximal LAD).  She had an occluded radial graft  to her circumflex and a patent vein graft to the PDA with excellent  anastomosis and good filling of the PDA and  retrograde filling of the PLA.  EF approximately 50% with multiple segmental wall motion abnormalities.  She  had 90-95% proximal circ and eccentric 75-85% mid circ with several marginal  branches in this area.  It was felt that her circumflex was he culprit  lesion in view of her symptoms of angina and positive Cardiolite with  lateral ischemia.  She was also found to have high-grade right renal artery  stenosis at diagnostic catheterization done because of hypertension with  normal renal function.   DESCRIPTION OF PROCEDURE:  The patient was brought to the second floor CP  lab in the postabsorptive state.  She was given four baby aspirins since she  had not been taking at home, but had been on chronic Plavix and was given 75  additional Plavix and one coated aspirin.  The right groin was prepped and  draped in the usual manner.  One percent Xylocaine was used for local  anesthesia.  The  right limb of the aortobifemoral graft was accessed with an  anterior puncture using an 18 thin walled needle and a 0.035 stiff Amplatz  wire was used because of access problems and scar tissue in the right groin.  We were able to get a 6 [place] short Daig sidearm sheath in without  difficulty.  The left coronary was intubated with a JL4 and then switched to  a JL3.5 Scimed guiding catheter with scout injections obtained.  The patient  was then given weight-adjusted heparin for a total of 4800 units during the  procedure and Aggrastat bolus plus infusion monitoring her ACT's.  After the  ACT post heparin was greater than 230 seconds the high-grade stenosis in the  circumflex was crossed with an Asahi 0.014 inch soft guide wire.   There was calcification in the proximal and mid circumflex as well as high-  grade 95% stenosis after the small OM-1 segmental 80% stenosis and tandem  lesion beyond this and ending just at about the small OM-2 branch.  The OM-2  branch had a very superior takeoff, was of moderate size and had a 70-80%  lesion at its ostia, but good flow.  The mid circumflex had a 95% focal  calcific stenosis  before OM-3 and a segmental 75-85% stenosis after the OM-  3 and across the OM-4 branch.  The distal circumflex had no significant  stenosis.   Initially a 2.5/15 cutting balloon was used.  We were able to cross into the  proximal circumflex.  It was not able to cross any further because of  calcification, tortuosity and probably high-grade stenosis in the mid  circumflex.  The proximal circumflex was dilated with the cutting balloon at  5-30 and 5-55.  The balloon was then exchanged for a CrossSail 2.5/15  balloon and the mid lesion was dilated at 10-29.  We then tracked across  with a 2.5/18 CYPHER DE stent.  There was difficulty crossing beyond the  proximal circumflex with poor guiding backup and probably residual stenosis. We then removed the stent and using exchange  techniques redilated the mid  circumflex with a 2.5/15 CrossSail balloon at eight atmospheres for 30  seconds.  We then were able to cross with a 2.5/18 CYPHER stent.  This was  positioned across the segmental stenosis beginning proximal to the third  marginal and ending beyond the fourth marginal branch.  It was deployed at 9-  21 and post dilated at 12-37.  The balloon was then pulled back.  We then  stented the proximal lesion beginning just proximal to the small OM-1 branch  covering the tandem lesion and ending just beyond the OM-2 branch with a DES  Cordis CYPHER 2.5/13 stent.  This was deployed and dilated at 14-40.  The  balloon was pulled back.  Final injections in RAO caudal and LAO caudal  projections showed good angiographic results with 0% residual narrowing of  the proximal circumflex, 0% residual narrowing in the mid circumflex and  good stent apposition.  Between the two stents there was approximately 40%  smooth narrowing with good residual lumen; and, good TIMI III flow to all  the marginal branches and the bifurcating distal circumflex, and small PABG  branches.   There was some pinching of the ostia of both the OM-3 and OM-4, but there  was good flow, so these were not addressed and they were approximately two  old vessels.   The ostial lesion of the OM-2 persisted, but there was good flow through  this; and, because of its superior takeoff and good flow we did not pursue  this either.   CATHETERIZATION DIAGNOSES:  1. Successful culprit lesion cutting balloon atherectomy and subsequent DES     stent, proximal circumflex.  2. Successful percutaneous transluminal coronary angioplasty and subsequent     DES stent, mid circumflex segmental stenosis, symptomatic.  3. Hyperlipidemia.  4. Coronary artery disease, remote diagonal myocardial infarction in 1976.  5. Status post multivessel coronary artery bypass graft, September 2000,     with recent catheterization showing  patent left internal mammary artery     to mid left anterior descending with good flow supplied via the left     carotid subclavian bypass graft; patent saphenous vein graft to posterior     descending artery with good retrograde flow to the posterolateral artery;     occluded left radial to circumflex; ejection fraction approximately 50-     55% with segmental wall motion abnormalities.  6. Stable Fontaine 2B claudication, right superior femoral artery occlusion,     failed, remote, right lower extremity grafts.  7. Remote aortobifemoral graft, Dr. Olga Millers, March 1988, patent.  8. Remote left carotid angioplasty, Dr. Arbie Cookey.  9. Subsequent remote left carotid subclavian bypass, Dr. Edilia Bo.  10.      Renovascular hypertension with known high-grade right renal artery     stenosis confirmed at last angiography, March 2004 and confirmed on a     duplex as an outpatient, November 29, 2002.                                                Richard A. Alanda Amass, M.D.    RAW/MEDQ  D:  12/04/2002  T:  12/06/2002  Job:  [id number]   cc:   Olene Craven, M.D.  [address]  [address]  [place]  [place] [address]  Fax: [phone]   CP Lab   Darlin Priestly, M.D.  [phone] [address]  [place]  [place] [address]  Fax: [phone]

## 2011-01-14 NOTE — Discharge Summary (Signed)
NAME:  Betty Floyd, Betty Floyd                       ACCOUNT NO.:  [id number]   MEDICAL RECORD NO.:  [id number]                   PATIENT TYPE:  INP   LOCATION:  3711                                 FACILITY:  [facility]   PHYSICIAN:  Richard A. Alanda Amass, M.D.          DATE OF BIRTH:  March 11, 1933   DATE OF ADMISSION:  09/30/2002  DATE OF DISCHARGE:  10/03/2002                                 DISCHARGE SUMMARY   DISCHARGE DIAGNOSES:  1. Chest pain, negative myocardial infarction secondary to #2.  2. Erosive esophagitis.  3. Superficial prepyloric ulcer.  4. Chronic constipation.  5. History of hypertension, controlled.  6. Hyperlipidemia, treated and controlled.  7. Coronary artery disease, status post coronary artery bypass graft in     2000.  8. Significant peripheral vascular disease with multiple surgeries and     interventions.   DISCHARGE CONDITION:  Improved.   PROCEDURE:  Esophagogastroduodenoscopy with close biopsy by Dr. Melvia Heaps October 02, 2002.   DISCHARGE MEDICATIONS:  1. Plavix 75 mg daily.  2. Lipitor 20 mg daily.  3. Protonix 40 mg 1 twice a day.  4. MiraLax 17 g 1 pack in 8 ounces of water twice admitted a day.  5. Colace 200 mg daily.  6. Surfak 240 mg daily.  7. Valium 5 mg three times a day.  8. Lopressor 75 mg which equals to one and a half 50-mg tablet twice a day.  9. Elavil 50 mg daily.  10.      Trental 40 mg three times a day.  11.      Ultram 50 mg 1 every six hours as needed for pain.  12.      Lasix 40 mg daily.  13.      Tricor 160 mg daily.  14.      Imdur 30 mg daily.   DISCHARGE INSTRUCTIONS:  1. No aspirin.  2. No Coumadin.  3. Activity as tolerated.  4. Low-fat/low-salt diet.  5. Follow up with Dr. Alanda Amass October 28, 2002 at 12:15 p.m.  6. Follow up with Dr. Lina Sar at [facility] if any further problems.   HISTORY OF PRESENT ILLNESS:  A 77 year old white female with prior history  of coronary disease presented to [facility] ER with  complaints of exertional chest  pain during her laundry.  She felt fine.  Burning pressure radiated to her  left shoulder.  The patient became weak and diaphoretic, also was dyspneic.  Denied nausea in the ER but did have it at home.  EMS was called.  She was  given four baby aspirin and was started on IV nitroglycerin in the ER.  She  only had a residual chest discomfort after this was started.   PAST MEDICAL HISTORY:  1. Coronary disease.  2. DMI in 1976.  3. Bypass grafting in 2000 by Dr. Kathlee Nations Trigt with a LIMA to the LAD,     left  radial artery to the OM, and saphenous vein graft to the PDA.  Last     Cardiolite was in 2003 and she had a scar but no ischemia.  4. Peripheral vascular disease with FEM-FEM crossover bypass grafting     occluded.  Multiple right FEM-POP and FEM-TIB bypass grafting remotely.     Left carotid subclavian bypass grafting and left carotid endarterectomy.     She was on Coumadin secondary to severe PVD.  She has not had a cardiac     catheterization since her CABG.   FAMILY HISTORY:  See H&P.   SOCIAL HISTORY:  See H&P.   REVIEW OF SYSTEMS:  See H&P.   ALLERGIES:  DYE allergies.   OUTPATIENT MEDICATIONS:  1. Lopressor.  2. Lasix.  3. Imdur.  4. Enteric-coated aspirin.  5. Coumadin.  6. Lipitor.  7. Tricor.  8. Trental.  9. Elavil.  10.      Darvocet.  11.      Valium.   DISCHARGE PHYSICAL EXAMINATION:  VITAL SIGNS:  Blood pressure 130/70, pulse  70, respirations 20, temperature 91 and just prior to discharge it was 98.7.  Room air oxygen saturation 94%.  GENERAL:  An alert and oriented female.  CHEST:  Lungs without rales, rhonchi, or wheezes.  HEART:  S1, S2.  Regular rate and rhythm.  No S3 gallop.   LABORATORY DATA:  Hemoglobin 11.9, hematocrit 34.2, neutrophils 66, lymphs  22, monos 10, eos 2, baso 1, WBC 6.  Pro time on admission 26.4, INR of 3,  PTT 72.  The last was done on February 3 with similar results.   Chemistries:   Sodium 140, potassium 4.2, chloride 26, CO2 29, glucose 117,  BUN 21, creatinine 1.2, calcium 8.9, total protein 6.4, albumin 3.7.  The  patient remained stable.  AST 27, ALT 19, ALP 42, total bilirubin 0.5, and  lipase 38.   Cardiac enzymes:  CK's ranged 28, 24, 22; MB 1.4, 1.1; troponin 0.01 to less  than 0.01.   Lipids:  Total cholesterol 141, triglycerides 183, HDL 31, LDL 73.   Biopsy of gastric mucosa:  H. pylori was negative.   EKG on admission:  Normal sinus rhythm, nonspecific ST-T wave abnormalities  and followup on February 3 consider anterior ischemia.   EGD on October 02, 2002 with erosive esophagitis and small ulcer.   Chest x-ray on admission is currently not in the chart.   HOSPITAL COURSE:  The patient was admitted by Dr. Jacinto Halim September 30, 2002  with chest pain, shortness of breath, and left shoulder pain.  She was  brought in to rule out MI, continued her Coumadin, and PPI was started.  GI  consult was obtained and EGD found erosive esophagitis.   She continued to improve and by October 03, 2002 she was discharged home by  Dr. Alanda Amass.  Coumadin was held at discharge.  The patient would need  outpatient Cardiolite at a later date.  She will continue on Plavix only and  follow up with Dr. Alanda Amass.     Darcella Gasman. [place], N.P.                     Richard A. Alanda Amass, M.D.    LRI/MEDQ  D:  11/29/2002  T:  12/02/2002  Job:  [id number]   cc:   Barbette Hair. Arlyce Dice, M.D. [facility]   Olene Craven, M.D.  [address]  [address]  [place]  [place] [address]  Fax:  [phone]  

## 2011-01-14 NOTE — H&P (Signed)
NAME:  Betty Floyd, AQUILAR                       ACCOUNT NO.:  [id number]   MEDICAL RECORD NO.:  [id number]                   PATIENT TYPE:  INP   LOCATION:  0101                                 FACILITY:  [facility]   PHYSICIAN:  Adolph Pollack, M.D.            DATE OF BIRTH:  Oct 11, 1932   DATE OF ADMISSION:  07/08/2003  DATE OF DISCHARGE:                                HISTORY & PHYSICAL   CHIEF COMPLAINT:  Persistent nausea and vomiting, progressive weakness.   HISTORY OF PRESENT ILLNESS:  [name] is a 77 year old female who is  status post exploratory laparotomy and enterolysis by [place]. Daphine Deutscher,  M.D. June 17, 2003 for a small-bowel obstruction.  She was discharged  eight days later and readmitted on June 26, 2003 because of recurrent  bloating, nausea and vomiting.  At that time, she stayed in the hospital and  was evaluated.  She had a small-bowel follow-through which was unremarkable.  It was felt that the nausea may be secondary to her narcotics at that time.  She subsequently was discharged to home July 01, 2003.  She called the  office today apparently complaining of persistent nausea and vomiting with  difficulty keeping anything down.  She was able to take her medications.  She is becoming progressively weaker.  She tells me she is passing some gas,  and moving her bowels, and able to keep some liquids down.  There have been  no fever or chills, no dysuria, no diarrhea.   PAST MEDICAL HISTORY:  1. Coronary artery disease.  2. Erosive esophagitis.  3. Gastroesophageal reflux disease.  4. Severe peripheral vascular disease.  5. Small-bowel obstruction.  6. Gastric ulcer.  7. Chronic constipation  8. Hypertension.  9. Hyperlipidemia.   PREVIOUS OPERATIONS:  1. Cholecystectomy.  2. Aortofemoral bypass.  3. Left carotid endarterectomy.  4. Coronary artery bypass.  5. Multiple lower extremity revascularization procedures.   ALLERGIES:  PHENERGAN and  IV CONTRAST DYE.   CURRENT MEDICATIONS:  1. Lopressor 75 mg twice a day.  2. Valium 5 mg three times a day.  3. Protonix 40 mg twice a day.  4. Lipitor 20 mg daily.  5. Trental 400 mg three times a day.  6. Tricor 160 mg a day.  7. Lasix 40 mg a day.  8. Plavix 75 mg a day.  9. Darvocet p.r.n. pain.  10.      Perdiem 4 a day.  11.      Aspirin 81 mg a day.   SOCIAL HISTORY:  She is a former smoker.  Denies alcohol use.  She is here  in the emergency department with her daughter.   REVIEW OF SYSTEMS:  CARDIAC:  She denies any chest pain.  PULMONARY:  No  increasing shortness of breath.  GI:  She denies abdominal pain.  She  complains of just nausea.  She has no significant bloating.  GU:  No  dysuria  or difficulty with urination.  HEMATOLOGIC:  She has easy bruisability.  NEUROLOGICAL:  No dizzy spells or syncopal episodes.   PHYSICAL EXAMINATION:  GENERAL:  An elderly weak-appearing female, appears  to be in no acute distress.  VITAL SIGNS:  Temperature 97.3, blood pressure 144/70, pulse 82, respiratory  rate 16.  O2 saturation is 99%.  SKIN:  Warm and dry.  No jaundice.  HEENT:  Eyes:  Extraocular motions intact.  No icterus noted.  NECK:  Supple with a left-sided scar.  No palpable masses or obvious thyroid  enlargement.  RESPIRATORY:  Breath sounds equal and clear.  Respirations are unlabored.  CHEST:  A well-healed midsternal scar.  CARDIOVASCULAR:  Demonstrates a regular rate and rhythm.  No JVD noted.  No  lower extremity edema.  She has 2+ left femoral pulse, 1+ right femoral  pulse.  ABDOMEN:  Soft and nontender, nondistended.  Active bowel sounds are noted.  There is a healing midline scar present.  No hernias noted.  There is a  transverse right lower quadrant scar noted.  MUSCULOSKELETAL:  There are multiple lower extremity scars noted.   LABORATORY DATA:  X-rays pending at this time.   IMPRESSION:  1. Postoperative nausea and vomiting and essentially failure  to thrive.  She     is able to keep some liquids down, has been getting progressively weaker.     Does have a history of erosive esophagitis.  She is on b.i.d. Protonix     for this.  Small-bowel follow-through for the last admission was     apparently negative for obstruction.  Certainly she could potentially     have some gastroparesis.  2. Severe peripheral vascular disease.  3. Coronary artery disease.  4. Erosive esophagitis with gastroesophageal reflux disease.  5. Severe deconditioned state.   PLAN:  Will admit her to the hospital, start IV fluid hydration.  Will go  ahead and obtain laboratory studies and x-rays tonight.  It may be that she  needs a gastroenterology consultation for this for the persistent nausea and  vomiting.  We cannot find any obvious obstruction.  I have explained this to  her and her daughter.                                               Adolph Pollack, M.D.    Kari Baars  D:  07/08/2003  T:  07/08/2003  Job:  [id number]

## 2011-01-14 NOTE — Discharge Summary (Signed)
NAMEMARGURETTE, BRENER             ACCOUNT NO.:  [id number]   MEDICAL RECORD NO.:  [id number]          PATIENT TYPE:  INP   LOCATION:  1412                         FACILITY[facility]  [facility]   PHYSICIAN:  Shirley Friar, MDDATE OF BIRTH:  July 17, 1933   DATE OF ADMISSION:  01/10/2006  DATE OF DISCHARGE:  01/20/2006                                 DISCHARGE SUMMARY   DISCHARGE DIAGNOSES:  1.  Mesenteric ischemia.  2.  Abdominal pain, secondary to number one.  3.  Vomiting, secondary to gastroparesis.  4.  History of coronary artery disease.  5.  History of congestive heart failure.  6.  History of peripheral vascular disease.  7.  History of chronic claudication.  8.  History of hyperlipidemia.  9.  History of hypertension.  10. History of renal artery stenosis with renal artery stenting on the      right.  11. History of previous coronary artery bypass graft surgery.   DISCHARGE MEDICATIONS:  1.  Aspirin 81 mg daily.  2.  Lipitor 20 mg daily.  3.  Pletal 100 mg b.i.d.  4.  Plavix 75 mg daily.  5.  Tri-Chlor 145 mg daily.  6.  Imdur 30 mg daily.  7.  Lactulose 30 mL b.i.d. p.r.n.  8.  Lopressor 100 mg daily.  9.  Protonix 40 mg daily.  10. Xanax 1 mg b.i.d. p.r.n.  11. Darvocet-N 100 mg p.o. q.i.d. p.r.n.  12. Zoloft 50 mg p.o. daily.   DISCHARGE DIET:  No restrictions.   ACTIVITY:  Increase activity slowly and use the walker.   DISCHARGE REFERRAL:  Home health physical therapy and occupational therapy.   WOUND CARE:  Not applicable.   FOLLOWUP:  1.  The patient is to return to Dr. Lacretia Nicks. Buren Kos at [facility] in one or two weeks.  2.  The patient is to return to Dr. Petra Kuba at [facility] in three to      four weeks.   CONDITION ON DISCHARGE:  Improved.   HISTORY OF PRESENT ILLNESS:  Ms. Dazey is a 77 year old white female who  was admitted secondary to abdominal pain and vomiting, with a known history  of gastroparesis and also described  dizzy spells with this abdominal pain  and vomiting.  She does have a history of peripheral vascular disease and my  concern when she presented to the emergency room was that she could have  mesenteric ischemia or a small bowel obstruction as the source of her  symptoms.  She had a CT scan done in the emergency room which was negative  for any acute findings, but due to her significant risk factors with  peripheral vascular disease and her severe abdominal pain, she was admitted  to evaluate for mesenteric ischemia.   HOSPITAL COURSE:  Due to her continued abdominal pain on admission, she was  set up with a CT angiogram which was done on the day after admission, and  this showed significant high-grade proximal SMA stenosis and an occluded IMA  origin.  Additional findings can be noted in  the CT angiogram report.  The  findings were thought to be atypical for mesenteric ischemia, and the  patient was set up for a colonoscopy by Dr. Ewing Schlein.  She had areas of  ischemia in her cecum which were confirmed on biopsy.  Based on these  findings, she underwent a mesenteric angiogram which revealed 75%-80%  stenosis of the proximal SMA.  This was treated with stent placement.  She  did well post-procedure.  Her symptoms improved.  She was doing well within  48 hours of that procedure.  Prior to the initial discharge she was  undergoing physical therapy and felt like she was too weak to go home.   There was a question of whether she would be a candidate for home health  physical therapy with her daughter, and a discussion was made concerning a  skilled nursing facility.  Prior to opening of a bed in the skilled nursing  facility, the patient and the patient's family wanted to take her home.  Therefore home health physical therapy was arranged for discharge home.  At  the time of this dictation, the patient is scheduled to go home on Jan 20, 2006, with the help of her family and home health physical  therapy.      Shirley Friar, MD  Electronically Signed     VCS/MEDQ  D:  01/20/2006  T:  01/20/2006  Job:  [id number]   cc:   Petra Kuba, M.D.  Fax: [phone]   Kari Baars, M.D.  Fax: [phone]

## 2011-01-14 NOTE — Op Note (Signed)
Betty Floyd, MASSARO             ACCOUNT NO.:  [id number]   MEDICAL RECORD NO.:  [id number]          PATIENT TYPE:  INP   LOCATION:  1412                         FACILITY:  [facility]   PHYSICIAN:  Petra Kuba, M.D.    DATE OF BIRTH:  04/06/1933   DATE OF PROCEDURE:  01/13/2006  DATE OF DISCHARGE:                                 OPERATIVE REPORT   PROCEDURE:  Colonoscopy with biopsy.   INDICATIONS:  Patient with right lower quadrant abdominal pain questionably  due to ischemia, want to try to confirm.  Consent was signed after risks,  benefits, methods, options thoroughly discussed with the patient and her  daughter multiple occasions.   MEDICINES USED:  Fentanyl 125 mcg, Versed 13 mg.   PROCEDURE:  Rectal inspection is pertinent for external hemorrhoids, small.  Digital exam was negative.  The video pediatric adjustable colonoscope was  inserted and with abdominal pressure and rolling her on back, were able to  advance to the cecum, which was identified by the ileocecal valve.  To get  into the cecal pole required rolling her on her right side.  Just below the  valve was an ulcerative area.  Could not tell whether it was ischemic for  mass-like, and biopsies were obtained.  It was in a difficult position to  biopsy, and we did roll her in all three positions to try get better angles  on the lesion but neither of the three positions seemed to be any better  than the others.  When she was on her back, we did peek inside the terminal  ileum.  A quick look in there was normal.  The scope was then slowly  withdrawn.  Not mentioned above, on insertion a small sigmoid polyp was seen  as were some left-sided diverticula, but no other abnormalities and no signs  of bleeding.  On slow withdrawal through the colon, the prep was adequate.  There was some liquid stool that required washing and suctioning.  A tiny  midtransverse polyp was seen and cold biopsied x1 and put in a second  container.  The left-sided diverticula were confirmed.  The small polyp seen  on insertion was seen and biopsied as well, put in the same container with  the other polyp.  The scope was withdrawn back to the rectum.  Anorectal  pull-through and retroflexion confirmed some small hemorrhoids.  The scope  was straightened, advanced a short way up the left side of the colon, air  was suctioned, the scope removed.  The patient tolerated the procedure well.  There was no obvious immediate complication.   ENDOSCOPIC DIAGNOSES:  1.  Internal-external hemorrhoids.  2.  Left-sided diverticula.  3.  Two tiny small polyps, one in the midsigmoid, cold biopsied, one in the      midtransverse, cold biopsied.  4.  Ulcerative questionable mass versus ischemia in the cecal pole, status      post biopsy.  5.  Otherwise within normal limits to the rest of the cecum and a quick look      in the terminal ileum.   PLAN:  Await pathology, will send it ASAP.  Keep on clear liquids now in  case surgical options are needed, since she is nice and clean.  I discussed  the above with the patient and the family.           ______________________________  Petra Kuba, M.D.     MEM/MEDQ  D:  01/13/2006  T:  01/13/2006  Job:  [id number]   cc:   Larina Earthly, M.D.  [address]  [place]  [place] [address]   Dr. Clelia Croft

## 2011-01-14 NOTE — Op Note (Signed)
   NAME:  Betty Floyd, Betty Floyd                       ACCOUNT NO.:  [id number]   MEDICAL RECORD NO.:  [id number]                   PATIENT TYPE:  INP   LOCATION:  0155                                 FACILITY:  [facility]   PHYSICIAN:  Thornton Park. Daphine Deutscher, M.D.             DATE OF BIRTH:  1933/05/28   DATE OF PROCEDURE:  06/17/2003  DATE OF DISCHARGE:                                 OPERATIVE REPORT   PREOPERATIVE DIAGNOSIS:  Small bowel obstruction.   POSTOPERATIVE DIAGNOSIS:  Adhesions producing proximal small bowel  obstruction.   PROCEDURE:  Exploratory laparotomy with enterolysis.   SURGEON:  Thornton Park. Daphine Deutscher, M.D.   ASSISTANT:  Sandria Bales. Ezzard Standing, M.D.   ANESTHESIA:  General endotracheal.   DESCRIPTION OF PROCEDURE:  Betty Floyd is a 77 year old lady, who was  admitted with abdominal pain, nausea, vomiting.  She has had this since  Friday, and today is Tuesday.  She was taken to room 1, given general  anesthesia.  The abdomen was prepped with Betadine and draped sterilely.  Midline incision was made.  I took down numerous omental adhesions to the  anterior abdominal wall until I entered the free space of the abdomen.  Upon  entering this area, I found markedly dilated loops of bowel proximally and  decompressed bowels distally.  Using gentle blunt and sharp dissection, I  was able to find the band and transect it, thereby releasing the bowel and  producing a loop of bowel with a nice indentation in it at the very abrupt  transition zone.  The enteral contents upstream easily flow downstream.  The  bowel itself was very viable.  There was a little bit of contusion of the  mesentery, but it looked entirely viable.  This was returned to its anatomic  position.  Remaining portion of the bowel appeared grossly normal, and there  were areas of the abdomen that were unreachable because of other areas of  adhesions.  These were not disturbed.   Sponge and needle counts were reported as  correct.  The omentum was then  brought down over the incision area, and the incision was closed with #1 PDS  from above and below, tying in the middle.  The wound was irrigated and  closed with staples.  The patient seemed to tolerate this procedure well,  and she was taken to the recovery room in satisfactory condition.                                               Thornton Park Daphine Deutscher, M.D.   MBM/MEDQ  D:  06/17/2003  T:  06/17/2003  Job:  [id number]   cc:   Olene Craven, M.D.  [address]  [address]  [place]  [place] [address]  Fax: [phone]

## 2011-01-14 NOTE — Discharge Summary (Signed)
NAME:  Betty Floyd, Betty Floyd                       ACCOUNT NO.:  [id number]   MEDICAL RECORD NO.:  [id number]                   PATIENT TYPE:  INP   LOCATION:  0454                                 FACILITY:  [facility]   PHYSICIAN:  Thornton Park. Daphine Deutscher, M.D.             DATE OF BIRTH:  1933/07/08   DATE OF ADMISSION:  07/08/2003  DATE OF DISCHARGE:  07/12/2003                                 DISCHARGE SUMMARY   ADMITTING DIAGNOSES:  Some abdominal pain and nausea.   DISCHARGE DIAGNOSES:  Some gastroparesis and motility problems.   HISTORY OF PRESENT ILLNESS:  For full details of the admission history and  physical, please see the typed admission note.  Briefly, this is a 77-year-  old lady who is postoperative from a simple band enterolysis, but kept  having some abdominal pain and inability to keep food down.  She was  admitted by Adolph Pollack, M.D. to my service and we had previously  admitted her and done an upper GI which showed her to not have any kind of  functional bowel issues.  However, we kept her on the service despite no  real surgical issues.  Fortunately, Lina Sar, M.D. [facility] saw her and placed  her on some Reglan and she responded to that and she was ready for discharge  about 25 days from her initial simple band enterolysis and it was my feeling  that she was on so many medications that it could have some role in her  decreasing appetite but we will refer to Olene Craven, M.D. on that.  Will be glad to see again as needed.   FINAL DIAGNOSES:  Gastrointestinal hypomotility.   PLAN:  Follow-up by Dr. Juanda Chance and Dr. Barbee Shropshire.                                               Thornton Park Daphine Deutscher, M.D.    MBM/MEDQ  D:  08/14/2003  T:  08/14/2003  Job:  [id number]   cc:   Lina Sar, M.D. [facility]   Olene Craven, M.D.  [address]  [address]  [place]  [place] [address]  Fax: [phone]

## 2011-01-14 NOTE — Discharge Summary (Signed)
NAME:  [name], [name]                       ACCOUNT NO.:  [id number]   MEDICAL RECORD NO.:  [id number]                   PATIENT TYPE:  INP   LOCATION:  0382                                 FACILITY:  [facility]   PHYSICIAN:  Sherin Quarry, MD                   DATE OF BIRTH:  1932-11-12   DATE OF ADMISSION:  06/13/2003  DATE OF DISCHARGE:  06/25/2003                                 DISCHARGE SUMMARY   HISTORY OF PRESENT ILLNESS:  Betty Floyd is a 77 year old lady with a  past history of coronary artery disease and gastroesophageal reflux disease,  who presented to the [facility] emergency room on June 13, 2003 with  complaints of abdominal pain.  She had been experiencing nausea, vomiting,  and progressive pain in the abdomen for about 24 hours prior to her  presentation.  A CT scan done in the emergency room showed focal left upper  quadrant dilated jejunal loops suggesting a closed loop small bowel  obstruction.   PHYSICAL EXAMINATION:  As described by Dr. Rito Ehrlich.  VITAL SIGNS:  Blood pressure was 132/66, pulse 82, temperature 97.2.  HEENT:  Within normal limits.  CHEST:  Clear to auscultation and percussion.  CARDIOVASCULAR:  Normal S1 and S2 without gross murmurs or gallops.  There  is a grade 2/6 systolic murmur.  ABDOMEN:  Soft.  There was mild diffuse tenderness.  Bowel sounds were  absent.  The abdomen was described as moderately distended.  EXTREMITIES:  No evidence of cyanosis or edema.   LABORATORY DATA:  Relevant laboratory studies obtained included a white  count of 10,600.  Electrolytes were within normal limits.  Creatinine was  1.2.  Liver profile was normal.  Amylase and lipase were both normal.   HOSPITAL COURSE:  On admission, the patient was made NPO and was given IV  fluids for rehydration.  Consultation was requested from Dr. Danna Hefty, as the patient continued to experience cramping abdominal pain,  and when an NG tube was placed, 2.5  liters of fluid were suctioned from the  stomach.  Dr. Luan Pulling initially recommended follow up CT scan be done to  assess the status of possible obstruction.  This study showed evidence of a  proximal small bowel obstruction with no evidence of definite etiology.  This suggested that the source of the obstruction might be adhesions.  Therefore, on June 17, 2003, the patient underwent exploratory laparotomy  with lysis of adhesions.  This was done by Dr. Daphine Deutscher and Dr. Ezzard Standing.  The  patient tolerated the procedure well.  Postoperatively, the patient was  given vigorous IV hydration.  Pain was managed with a PCA pump.  Her course  was one of slow improvement, and she was slowly able to resume a regular  diet.  On June 18, 2003, she was noted to have evidence of a Klebsiella  urinary tract infection, and was therefore  placed on Cipro, which she took  for a total of seven days.  By June 25, 2003, the patient was felt to be  ready for discharge from a surgical standpoint, and therefore, the plan was  made to discharge the patient at that time.   DISCHARGE DIAGNOSES:  1. Acute small bowel obstruction secondary to adhesions, status post lysis     of adhesions.  2. Coronary artery disease.  3. Peripheral vascular disease.  4. Gastroesophageal reflux disease.  5. History of prepyloric ulcers.  6. Chronic constipation.  7. Hypertension.  8. Hyperlipidemia.   DISCHARGE MEDICATIONS:  1. Lortab 5/500, 1-2 q.4h. p.r.n. for pain.  2. She will also be instructed to take Protonix 40 mg daily, as she was at     home.  3. She will also take Plavix 75 mg daily.  4. Lipitor 20 mg daily.  5. She will resume MiraLax.  6. She will also resume Imdur 30 mg daily.   FOLLOW UP:  She is going to be following up with the surgical doctors in  four weeks, and of course will be continue to be followed on a regular basis  by Dr. Barbee Shropshire.                                               Sherin Quarry, MD    SY/MEDQ  D:  06/25/2003  T:  06/25/2003  Job:  [id number]   cc:   Olene Craven, M.D.  [address]  [address]  [place]  [place] [address]  Fax: [phone]   Thornton Park. Daphine Deutscher, M.D.  [address]  [place]  [place] [address]  Fax: [phone]

## 2011-01-14 NOTE — Discharge Summary (Signed)
   NAME:  Betty Floyd, Betty Floyd                       ACCOUNT NO.:  [id number]   MEDICAL RECORD NO.:  [id number]                   PATIENT TYPE:  INP   LOCATION:  0362                                 FACILITY:  [facility]   PHYSICIAN:  Thornton Park. Daphine Deutscher, M.D.             DATE OF BIRTH:  05-29-1933   DATE OF ADMISSION:  06/26/2003  DATE OF DISCHARGE:  07/01/2003                                 DISCHARGE SUMMARY   ADMISSION DIAGNOSES:  1. Nausea, vomiting, rule out small bowel obstruction.  2. Post enterotomy for small bowel obstruction.   DISCHARGE DIAGNOSES:  1. Nausea and vomiting possibly secondary to Vicodin.  2. Normal small bowel follow through.   HISTORY OF PRESENT ILLNESS:  [name] is a 77 year old lady who had been  discharged the previous day after an exploratory laparotomy and enterolysis  for a fairly defined small bowel obstruction from an adhesion. She had  gotten along well, gone home, and had taken some Vicodin but, nevertheless,  started having nausea and vomiting a bit and came back to the emergency  room. She was seen by Dr. Derrell Lolling and admitted. While admitted, I went ahead  and got a small bowel follow through, which is totally normal. In the  meantime, she began getting a little more sense of well being and since she  lives alone, was feeling a little stronger prior to discharge.   FINAL DIAGNOSES:  Postoperative nausea probably secondary to narcotic pain  relievers.                                               Thornton Park Daphine Deutscher, M.D.    MBM/MEDQ  D:  07/07/2003  T:  07/07/2003  Job:  [id number]

## 2011-01-14 NOTE — H&P (Signed)
NAME:  Betty Floyd, Betty Floyd                       ACCOUNT NO.:  [id number]   MEDICAL RECORD NO.:  [id number]                   PATIENT TYPE:  EMS   LOCATION:  ED                                   FACILITY:  [facility]   PHYSICIAN:  Angelia Mould. Derrell Lolling, M.D.             DATE OF BIRTH:  Dec 30, 1932   DATE OF ADMISSION:  06/26/2003  DATE OF DISCHARGE:                                HISTORY & PHYSICAL   CHIEF COMPLAINT:  Nausea and vomiting.   HISTORY OF PRESENT ILLNESS:  This is a 77 year old white female who was  recently discharged from [facility] yesterday.  She had presented  with abdominal distention and vomiting and was found to have a small-bowel  obstruction.  This did not resolve, and she ultimately had to undergo a  laparotomy and lysis of adhesions by Dr. Luretha Murphy.  That surgery was  June 17, 2003.  She had a postop ileus which slowly resolved, and she was  discharged yesterday.  She had been having bowel movements and had been  tolerating some food.  She states that she admittedly was still a little bit  nauseated.   She did not have a bowel movement yesterday or today.  Today, she has  vomited three times which was bilious vomiting.  The first time or two was a  significant amount; the last time was a minimal amount.  She says she is not  passing any flatus today.  She is nervous and anxious and is requesting  medical attention.  She is here with her daughter.   She denies fever or chills, denies any significant abdominal pain.  She has  been taking Vicodin regularly at the house, however.   PAST MEDICAL HISTORY:  1. Coronary artery disease status post coronary artery bypass grafting.  2. Peripheral vascular disease, status post aortofemoral bypass grafting,     status post left carotid subclavian bypass.  3. Gastroesophageal reflux disease.  4. History of erosive esophagitis.  5. History of prepyloric ulcer.  6. History of hypertension.  7. History of  hyperlipidemia.  8. Laparotomy with lysis of adhesions on June 17, 2003.   CURRENT MEDICATIONS:  1. Vicodin.  2. Plavix 75 mg daily.  3. Lipitor 25 mg daily.  4. Tricor 160 mg daily.  5. Protonix 40 mg daily.  6. MiraLax 17 g daily.  7. Lopressor 75 mg daily.  8. Imdur 30 mg daily.  9. Lasix 40 mg daily.   DRUG ALLERGIES:  IVP DYE and possibly ASPIRIN.   SOCIAL HISTORY:  She does not currently drink or smoke.   FAMILY HISTORY:  Noncontributory.   REVIEW OF SYSTEMS:  A 13-system Review of Systems is performed and  noncontributory except as described above.   PHYSICAL EXAMINATION:  GENERAL:  Pleasant, older white female. She had a  very flattened affect.  She appears deconditioned.  She is not in any acute  distress,  just looks mildly uncomfortable.  VITAL SIGNS:  Temperature 99.0, pulse 109, respirations 18, blood pressure  124/74.  HEENT:  Eyes: Sclerae clear.  Extraocular movements intact.  NECK:  Well-healed scars in the left neck from previous vascular surgery.  There is no adenopathy or thyroid mass.  LUNGS:  Clear to auscultation.  HEART:  Regular rate and rhythm, faint systolic murmur, well-healed  sternotomy scar.  ABDOMEN:  Soft, hypoactive bowel sounds.  Midline wound healing nicely.  No  hernia or drainage.  No palpable mass.  Minima tenderness.  Not tympanitic,  not really distended but somewhat obese.  EXTREMITIES:  Full range of motion, no clubbing, cyanosis, or edema.   ADMISSION LABORATORY DATA:  Chest x-ray shows no active cardiopulmonary  disease.  Multiple metal clips and sternal wires are noted.   Abdominal x-rays show a nonspecific bowel gas pattern.  A little bit of  scattered small-bowel gas and some rectal gas is noted.   CBC shows hemoglobin 12.1, white blood cell count 9000.  Basic metabolic  panel is completely normal.   ASSESSMENT:  1. Nausea and vomiting.  This may simply be a paralytic ileus secondary to     Vicodin use or could be an  early postoperative recurrent small-bowel     obstruction.  I suspect the former.  I doubt that there is any     compromised bowel.  2. Coronary artery disease.  3. Peripheral vascular disease.   PLAN:  The patient will be admitted for observation and IV hydration.  We  will allow clear liquid diet and her oral medications.  If she has any  further GI symptoms, we will repeat her imaging studies.                                               Angelia Mould. Derrell Lolling, M.D.    HMI/MEDQ  D:  06/26/2003  T:  06/26/2003  Job:  [id number]   cc:   Olene Craven, M.D.  [address]  [address]  [place]  [place] [address]  Fax: [phone]

## 2011-01-14 NOTE — Consult Note (Signed)
NAME:  Betty Floyd, Betty Floyd                       ACCOUNT NO.:  [id number]   MEDICAL RECORD NO.:  [id number]                   PATIENT TYPE:  INP   LOCATION:  0460                                 FACILITY:  [facility]   PHYSICIAN:  Hollice Espy, M.D.            DATE OF BIRTH:  05/16/1933   DATE OF CONSULTATION:  DATE OF DISCHARGE:                                   CONSULTATION   REQUESTING PHYSICIANS:  1. Dr. Thornton Park. Martin from surgery.  2. Dr. Petra Kuba from gastroenterology.   REASON FOR CONSULT:  Medical management including electrolyte abnormalities.   HISTORY OF PRESENT ILLNESS:  This is a 77 year old white female with a  history of small-bowel obstruction in the past secondary to adhesions, who  in the interim has re-presented at times with episodes of severe nausea and  vomiting and found to have no episodes of recurrent obstruction.  She  presented again on July 08, 2003 complaining of the same complaints of  just nausea, vomiting and abdominal pain.  She had been discharged a few  days prior with a diagnosis of nausea secondary to narcotics.  Radiology was  done, which showed no evidence of further obstruction, and gastroenterology  was consulted, who saw this patient.  The patient had no evidence of any  fever or abdominal pain and a small-bowel follow-through done at the most  recent previous admission showed no evidence of any mechanical obstruction  again.  The patient was also complaining of some mild dysphagia.  A recent  colonoscopy done by GI showed no evidence of obstruction from below.  The  patient had been treated supportively.  In addition, she was found to have  low electrolytes with potassium of 3.1 and she had, on July 09, 2003, an  elevated calcium of 10.8.  A previous calcium on her previous admission was  in the normal range of 8.4.  The patient additionally also has history of  peripheral vascular disease, but this was relatively stable.   For medical  management purposes, the patient's PCP, Dr. Olene Craven, was  contacted, who passed this communication along to the [facility] hospitalists for  medical consult management.  Currently, the patient states that she is  feeling somewhat better.  Her nausea is much less so and she has minimal  abdominal pain.  She otherwise has no complaints.  She denies any shortness  of breath or chest pain or wheezing or cough.  She does complain of  occasional reflux-like symptoms but otherwise overall she states she is  feeling better than she has for the past few days.  She denies any hematuria  or dysuria and she denies any problem with bowel movements as well.  She  denies any headaches, visual changes, hearing loss, extremity pain or  weakness.  She does complain of overall fatigue.   PAST MEDICAL HISTORY:  1. Severe peripheral vascular disease.  2. CAD.  3. Hypertension.  4. History of small-bowel obstruction with secondary lysis of adhesions and     subsequent repeat studies that revealed no further adhesions.  5. She has a history of hiatal hernia and GERD as well.   MEDICATIONS:  Currently, the patient has been on:  1. Protonix 40 mg daily.  2. Plavix 75 mg.  3. Lipitor 20 mg.  4. MiraLax.  5. Imdur.  6. Lortab 5/500 p.r.n.   These are from her discharge summary following her surgery.   In addition currently, she is also on now p.r.n. Zofran and Darvocet,  Ambien, Zocor, Kay Ciel, Protonix, Lopressor, Regan, Lasix, subcu Lovenox  for DVT prophylaxis, Trental and Valium.   ALLERGIES:  The patient is allergic to ALTACE, DESFERAL, which is  DEFEROXAMINE, PHENERGAN and CONTRAST MEDIA.  In the past, she has been also  on A NO-ASPIRIN RECOMMENDATION, but this not believed to be a true allergy.   SOCIAL HISTORY:  In addition, she does not actively drink or smoking.   FAMILY HISTORY:  Her family history is noncontributory.   PHYSICAL EXAMINATION:  VITAL SIGNS:  Currently,  her blood pressure is  127/52, pulse 59, respirations 20, temperature 97.0.  She is saturating well  at 95% on room air.  GENERAL:  In general, she is in no apparent distress.  She is alert and  oriented x3 and very pleasant.  HEENT:  Normocephalic.  She is atraumatic.  Her mucous membranes are a  little bit dry.  NECK:  She has no carotid bruits.  She has no palpable thyroid mass.  HEART:  Her heart is regular rate and rhythm, S1 and S2.  LUNGS:  She is clear to auscultation bilaterally.  ABDOMEN:  Her abdomen is soft and nontender.  She has hypoactive bowel  sounds, but they are present.  Even with deep auscultation, she is still  nontender.  EXTREMITIES:  Extremities show no clubbing or cyanosis.  She has very poor  palpable pulses and approximately trace-to-1+ edema bilaterally.   LABORATORY WORK:  Her most recent lab work includes lipase of 65.  Her urine  microscopy shows many bacteria and positive nitrites and small leukocyte  esterase.  Her BMP on July 08, 2003 showed a sodium of 144, potassium  3.1, chloride 107, bicarb 28, BUN 11, creatinine 1.0, glucose 113.  Her LFTs  just have note of an AST of 39, which is just high-normal, and a calcium of  10.8.  An albumin is in the normal range at 4.3.  There is a white count of  7.6, hemoglobin and hematocrit 14.4 and 42.0, MCV of 94, platelet count of  266,000  She has no left shift.  Her amylase at 101 is normal.  It is of  note that a previous calcium level done two weeks prior is at 8.6.   RADIOLOGY STUDIES:  Radiology studies include an acute abdomen which shows  no acute abnormality, sigmoid colon diverticula, but otherwise normal, no  evidence of any free intraperitoneal air.   ASSESSMENT AND PLAN:  [name] is a 77 year old white female with a  history of small-bowel obstruction secondary to adhesions, who presents with  recurrent abdominal pain and nausea and vomiting.  1. In regards to her nausea, vomiting and  abdominal pain, this may be a     simple matter of postoperative pain and intolerance of narcotics, but as     gastroenterology is following, would definitely agree with her     esophagogastroduodenoscopy,  proton pump inhibitor and Reglan.  I noticed     a slight bump in her lipase but likely not contributory.  2. Hypokalemia.  Electrolyte deficiency may be secondary to patient's     vomiting and she is on chronic Lasix.  I recommend K-Dur 20 mEq p.o.     daily and we will go ahead and start this now.  3. Hypercalcemia at 10.8.  She has no previous history of hypercalcemia and     she has no swelling or symptoms from this; this is not markedly high     either.  I would repeat this calcium level and for validity, would check     an ionized calcium level as well.  I suspect this may be more lab error     than anything else.  A PTH has been ordered and this may further clarify     matters.  4. Failure to thrive.  The patient actually seems to be doing relatively     well.  Her nutrition status is good.  Her albumin is actually within the     normal range, suspecting therefore that she does not have a long-term     chronic nausea.  These may be acute episodes, again contributing more     from postoperative pain.  In terms of deconditioning, would go with     physical therapy and rehabilitation evaluation.  5. As noted, bacteriuria.  Even though we normally would not need to treat,     as the patient does not complain of any dysuria, in this case, I would     recommend three days of Bactrim DS, given her other problems.  6. History of severe peripheral vascular disease which is stable.  7. History of hypertension which is stable.   Thank you very much for letting us assist in the care of this patient.  We  will follow his labs tomorrow.                                               Hollice Espy, M.D.    SKK/MEDQ  D:  07/10/2003  T:  07/10/2003  Job:  [id number]   cc:   Olene Craven, M.D.  [address]  [address]  [place]  [place] [address]  Fax: [phone]

## 2011-04-30 DIAGNOSIS — Z9289 Personal history of other medical treatment: Secondary | ICD-10-CM

## 2011-04-30 HISTORY — DX: Personal history of other medical treatment: Z92.89

## 2011-05-03 ENCOUNTER — Inpatient Hospital Stay: Admission: AD | Admit: 2011-05-03 | Payer: Self-pay | Source: Ambulatory Visit | Admitting: Cardiovascular Disease

## 2011-05-04 ENCOUNTER — Ambulatory Visit (INDEPENDENT_AMBULATORY_CARE_PROVIDER_SITE_OTHER): Payer: Medicare Other | Admitting: Vascular Surgery

## 2011-05-04 ENCOUNTER — Encounter: Payer: Self-pay | Admitting: Vascular Surgery

## 2011-05-04 VITALS — BP 135/52 | HR 82 | Resp 18 | Ht 64.0 in | Wt 163.0 lb

## 2011-05-04 DIAGNOSIS — I999 Unspecified disorder of circulatory system: Secondary | ICD-10-CM

## 2011-05-04 DIAGNOSIS — I70219 Atherosclerosis of native arteries of extremities with intermittent claudication, unspecified extremity: Secondary | ICD-10-CM

## 2011-05-04 DIAGNOSIS — I998 Other disorder of circulatory system: Secondary | ICD-10-CM

## 2011-05-04 NOTE — Progress Notes (Signed)
The patient is seen today as an add-on for concern regarding erythema of her right great toe. She is well known to me from extensive past peripheral vascular issues. She had multiple prior interventions dating back to the femoral to femoral bypass graft in 1977. 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 These have all been documented for many years and she has tolerated this level of ischemia. She also has known occlusion of her right superficial femoral artery. She had an episode of erythema in her right great toe proximally one month ago which responded with antibiotic therapy. She's had a recurrence and is seeing [place] today for further discussion.  Past Medical History  Diagnosis Date  . Gastroparesis   . Coronary artery disease   . Myocardial infarction   . Hypertension   . Hyperlipidemia   . Carotid artery occlusion     History  Substance Use Topics  . Smoking status: Former Smoker -- 1.0 packs/day for 20 years    Types: Cigarettes    Quit date: 05/03/1986  . Smokeless tobacco: Not on file  . Alcohol Use: No    Family History  Problem Relation Age of Onset  . Diabetes Mother   . Heart disease Father   . Heart disease Sister   . Heart disease Brother   . Heart attack Brother     Allergies  Allergen Reactions  . Iohexol      Code: HIVES, Desc: 01-11-06---pt had erythema & mild itching with 13 hr prep, Onset Date: 30160109     Current outpatient prescriptions:acetaminophen (TYLENOL) 500 MG tablet, Take 500 mg by mouth. Take 2 tablets three times/ day , Disp: , Rfl: ;  ALPRAZolam (XANAX) 1 MG tablet, Take 1 mg by mouth 2 (two) times daily.  , Disp: , Rfl: ;  aspirin 81 MG tablet, Take 81 mg by mouth daily.  , Disp: , Rfl: ;  atorvastatin (LIPITOR) 20 MG tablet, Take 20 mg by mouth daily.  , Disp: , Rfl:  calcium-vitamin D (OSCAL WITH D) 500-200 MG-UNIT per tablet, Take 1 tablet by mouth 2 (two) times daily.  Calcium 600mg  + Vita D 500 mg dosage , Disp: , Rfl: ;  cephALEXin (KEFLEX) 500 MG capsule, Take 500 mg by mouth 4 (four) times daily.  , Disp: , Rfl: ;  cilostazol (PLETAL) 100 MG tablet, Take 100 mg by mouth 2 (two) times daily.  , Disp: , Rfl: ;  clopidogrel (PLAVIX) 75 MG tablet, Take 75 mg by mouth daily.  , Disp: , Rfl:  DULoxetine (CYMBALTA) 60 MG capsule, Take 60 mg by mouth daily.  , Disp: , Rfl: ;  fenofibrate (TRICOR) 145 MG tablet, Take 145 mg by mouth daily.  , Disp: , Rfl: ;  losartan (COZAAR) 50 MG tablet, Take 50 mg by mouth daily.  , Disp: , Rfl: ;  metoprolol tartrate (LOPRESSOR) 25 MG tablet, Take 25 mg by mouth 2 (two) times daily.  , Disp: , Rfl: ;  Omega-3 Fatty Acids (FISH OIL) 1000 MG CAPS, Take 1 capsule by mouth daily.  , Disp: , Rfl:  pantoprazole (PROTONIX) 40 MG tablet, Take 40 mg by mouth 2 (two) times daily.  , Disp: , Rfl: ;  polyethylene glycol (MIRALAX / GLYCOLAX) packet, Take 17 g by mouth daily.  , Disp: , Rfl: ;  pregabalin (LYRICA) 100 MG capsule, Take 100 mg by mouth 3 (three) times daily.  ,  Disp: , Rfl: ;  sucralfate (CARAFATE) 1 G tablet, Take 1 g by mouth 3 (three) times daily.  , Disp: , Rfl:  temazepam (RESTORIL) 15 MG capsule, Take 15 mg by mouth at bedtime as needed.  , Disp: , Rfl: ;  traMADol (ULTRAM) 50 MG tablet, Take 50 mg by mouth. Take 2 tablets three times/ day , Disp: , Rfl:   BP 135/52  Pulse 82  Resp 18  Ht 5\' 4"  (1.626 m)  Wt 163 lb (73.936 kg)  BMI 27.98 kg/m2  Body mass index is 27.98 kg/(m^2).       Review of systems shortness of breath with exertion gastroesophageal reflux, dizziness, recurrent urinary tract infections, sore throat, no weight loss or weight gain, otherwise review of systems negative.  Physical exam: Frail white female appearing stated age 77 breathing is unlabored heart regular rate and rhythm abdomen moderate obesity no tenderness noted. She has absent right femoral pulse 2+ left femoral pulse and a palpable left  dorsalis pedis pulse. She does have erythema with no fluctuance over right great toe extending slightly up onto the dorsum of her foot.  I discussed this with the patient and her daughter present. She certainly is at risk for nonhealing due to her none of chronic severe right lower extremity arterial insufficiency. She is on Keflex Mavik send this for a total of 2 weeks. I will see her again in 2 weeks for followup. She would be a candidate for femoral to femoral bypass based on her most recent arteriogram from 2010 she does have a normal left femoral pulse. Hopefully we can avoid any surgical intervention but this may be required for limb salvage

## 2011-05-19 ENCOUNTER — Encounter: Payer: Self-pay | Admitting: Vascular Surgery

## 2011-05-23 ENCOUNTER — Encounter: Payer: Self-pay | Admitting: Vascular Surgery

## 2011-05-24 ENCOUNTER — Ambulatory Visit (INDEPENDENT_AMBULATORY_CARE_PROVIDER_SITE_OTHER): Payer: Medicare Other | Admitting: Vascular Surgery

## 2011-05-24 ENCOUNTER — Encounter: Payer: Self-pay | Admitting: Vascular Surgery

## 2011-05-24 VITALS — BP 134/69 | HR 77 | Resp 16 | Ht 64.0 in | Wt 163.0 lb

## 2011-05-24 DIAGNOSIS — I70219 Atherosclerosis of native arteries of extremities with intermittent claudication, unspecified extremity: Secondary | ICD-10-CM

## 2011-05-24 DIAGNOSIS — I999 Unspecified disorder of circulatory system: Secondary | ICD-10-CM

## 2011-05-24 NOTE — Progress Notes (Signed)
The patient presents today for followup of her right foot erythema. Fortunately she has completely resolved the erythema on the dorsum of her foot. He has no evidence of fluctuance or infection. She does have known chronic severe arterial insufficiency in her right leg. I discussed this at length with the patient and her daughter present. I do not see any indication for further evaluation or treatment. She would be a candidate for a left to right fem-fem if she develop progressive symptoms. She does have chronic leg pain but this does not appear to be related to arterial insufficiency her  She will followup with Dr. Alanda Amass for serial arterial Doppler studies.

## 2011-06-13 LAB — POCT CARDIAC MARKERS
CKMB, poc: <1 — ABNORMAL LOW
Myoglobin, poc: 121
Operator id: 3206
Troponin i, poc: <0.05

## 2011-06-13 LAB — DIFFERENTIAL
Basophils Absolute: 0
Basophils Relative: 0
Lymphocytes Relative: 20
Neutro Abs: 3.7
Neutrophils Relative %: 67

## 2011-06-13 LAB — BASIC METABOLIC PANEL
CO2: 30
Calcium: 10.3
Creatinine, Ser: 1.16
GFR calc Af Amer: 56 — ABNORMAL LOW
GFR calc non Af Amer: 46 — ABNORMAL LOW
Glucose, Bld: 90
Sodium: 144

## 2011-06-13 LAB — RAPID URINE DRUG SCREEN, HOSP PERFORMED
Amphetamines: NOT DETECTED
Benzodiazepines: POSITIVE — AB
Cocaine: NOT DETECTED
Opiates: NOT DETECTED
Tetrahydrocannabinol: NOT DETECTED

## 2011-06-13 LAB — URINALYSIS, ROUTINE W REFLEX MICROSCOPIC
Nitrite: POSITIVE — AB
Protein, ur: NEGATIVE
Urobilinogen, UA: 0.2

## 2011-06-13 LAB — CBC
Hemoglobin: 13.3
MCHC: 34.6
RBC: 4.06
RDW: 12.6

## 2011-12-07 ENCOUNTER — Other Ambulatory Visit: Payer: Self-pay | Admitting: Vascular Surgery

## 2012-04-09 ENCOUNTER — Other Ambulatory Visit: Payer: Self-pay | Admitting: Internal Medicine

## 2012-04-09 DIAGNOSIS — R1031 Right lower quadrant pain: Secondary | ICD-10-CM

## 2012-04-10 ENCOUNTER — Ambulatory Visit
Admission: RE | Admit: 2012-04-10 | Discharge: 2012-04-10 | Disposition: A | Discharge status: Home / Self Care | Payer: Medicare Other | Source: Ambulatory Visit | Attending: Internal Medicine | Admitting: Internal Medicine

## 2012-04-10 DIAGNOSIS — R1031 Right lower quadrant pain: Secondary | ICD-10-CM

## 2012-04-16 ENCOUNTER — Encounter: Payer: Self-pay | Admitting: Vascular Surgery

## 2012-04-17 ENCOUNTER — Encounter: Payer: Self-pay | Admitting: Vascular Surgery

## 2012-04-17 ENCOUNTER — Ambulatory Visit (INDEPENDENT_AMBULATORY_CARE_PROVIDER_SITE_OTHER): Payer: Medicare Other | Admitting: Vascular Surgery

## 2012-04-17 VITALS — BP 108/64 | HR 76 | Resp 18 | Ht 65.0 in | Wt 157.0 lb

## 2012-04-17 DIAGNOSIS — I739 Peripheral vascular disease, unspecified: Secondary | ICD-10-CM | POA: Insufficient documentation

## 2012-04-17 NOTE — Progress Notes (Signed)
Patient presents today for discussion of her recent CT scan on 04/10/2012. She's had several months of right lower quadrant pain and tenderness and underwent further evaluation with CT scan. This did not show any at abnormality to explain the abdominal soreness. She does have extensive atherosclerotic disease as is none and a CT scan showed that she did have a standing significant aortic narrowing above her prior aortofemoral bypass. She has an extensive past history of diffuse peripheral vascular and cardiac disease. She is status post aortobifemoral bypass greater than 20 years ago by Dr. Durwin Nora. She's had extensive extracranial cerebrovascular occlusive disease with known super aortic trunk narrowing as well. She continues to have generalized failing health. He does report a right leg claudication and occasional numbness in her right leg  Past Medical History  Diagnosis Date  . Gastroparesis   . Coronary artery disease   . Myocardial infarction   . Hypertension   . Hyperlipidemia   . Carotid artery occlusion     History  Substance Use Topics  . Smoking status: Former Smoker -- 1.0 packs/day for 20 years    Types: Cigarettes    Quit date: 05/03/1986  . Smokeless tobacco: Never Used  . Alcohol Use: No    Family History  Problem Relation Age of Onset  . Diabetes Mother   . Heart disease Father   . Heart disease Sister   . Heart disease Brother   . Heart attack Brother     Allergies  Allergen Reactions  . Iohexol      Code: HIVES, Desc: 01-11-06---pt had erythema & mild itching with 13 hr prep, Onset Date: 16109604     Current outpatient prescriptions:acetaminophen (TYLENOL) 500 MG tablet, Take 500 mg by mouth. Take 2 tablets three times/ day , Disp: , Rfl: ;  ALPRAZolam (XANAX) 1 MG tablet, Take 0.25 mg by mouth 2 (two) times daily. , Disp: , Rfl: ;  aspirin 81 MG tablet, Take 81 mg by mouth daily.  , Disp: , Rfl: ;  atorvastatin (LIPITOR) 20 MG tablet, Take 20 mg by mouth daily.   , Disp: , Rfl:  calcium-vitamin D (OSCAL WITH D) 500-200 MG-UNIT per tablet, Take 1 tablet by mouth 2 (two) times daily. Calcium 600mg  + Vita D 500 mg dosage , Disp: , Rfl: ;  cilostazol (PLETAL) 100 MG tablet, Take 100 mg by mouth 2 (two) times daily.  , Disp: , Rfl: ;  clopidogrel (PLAVIX) 75 MG tablet, Take 75 mg by mouth daily.  , Disp: , Rfl: ;  DULoxetine (CYMBALTA) 60 MG capsule, Take 60 mg by mouth daily.  , Disp: , Rfl:  fenofibrate (TRICOR) 145 MG tablet, Take 145 mg by mouth daily.  , Disp: , Rfl: ;  isosorbide mononitrate (IMDUR) 30 MG 24 hr tablet, Take 30 mg by mouth daily., Disp: , Rfl: ;  losartan (COZAAR) 50 MG tablet, Take 50 mg by mouth daily.  , Disp: , Rfl: ;  metoprolol tartrate (LOPRESSOR) 25 MG tablet, Take 25 mg by mouth 2 (two) times daily.  , Disp: , Rfl:  Omega-3 Fatty Acids (FISH OIL) 1000 MG CAPS, Take 1 capsule by mouth daily.  , Disp: , Rfl: ;  pantoprazole (PROTONIX) 40 MG tablet, Take 40 mg by mouth 2 (two) times daily.  , Disp: , Rfl: ;  polyethylene glycol (MIRALAX / GLYCOLAX) packet, Take 17 g by mouth daily.  , Disp: , Rfl: ;  pregabalin (LYRICA) 100 MG capsule, Take 100 mg by mouth  3 (three) times daily.  , Disp: , Rfl:  sucralfate (CARAFATE) 1 G tablet, Take 1 g by mouth 3 (three) times daily.  , Disp: , Rfl: ;  temazepam (RESTORIL) 15 MG capsule, Take 15 mg by mouth at bedtime as needed.  , Disp: , Rfl: ;  traMADol (ULTRAM) 50 MG tablet, Take 50 mg by mouth. Take 2 tablets three times/ day , Disp: , Rfl: ;  cephALEXin (KEFLEX) 500 MG capsule, Take 500 mg by mouth 4 (four) times daily.  , Disp: , Rfl:   BP 108/64  Pulse 76  Resp 18  Ht 5\' 5"  (1.651 m)  Wt 157 lb (71.215 kg)  BMI 26.13 kg/m2  Body mass index is 26.13 kg/(m^2).      Physical exam frail-appearing white female no acute distress Pulse status 2+ right radial pulse she has had radial artery harvest in the left and does have a palpable 2+ left ulnar pulse she has a palpable left femoral pulse I do  not palpate a right femoral pulse. Both feet are warm and well perfused with no tissue loss Heart is regular rate and rhythm Chest clear bilaterally Carotid arteries without bruits bilaterally  I do have a copy of his recent noninvasive ultrasound and Dr. Kandis Cocking office. This revealed maximum index of 0.32 on the right of 0.75 left  I did review her CT scan and this does show extensive calcification of her aorta with luminal narrowing due to the calcified plaque in the area between the level of renal arteries and aortofemoral bypass. This was a noncontrast study due to dye allergy.  Had a long discussion with the patient and her daughter present. I do not see any evidence of any critical issues on her CT scan and do not feel her chronic right lower quadrant pain is related in the arterial issues. She does have a prior spare mesenteric artery stent and this is in good position. I explained that without contrast it is impossible to tell whether there is any narrowing but her symptoms related to not go along with chronic mesenteric ischemia. She does probable have occlusion of right limb of her aortofemoral graft or native common femoral occlusion on the right. She is clearly having claudication symptoms but has many dilatations as well. Comfortable with continued observation his lungs she does not have any tissue loss. She understands this and will continue followup noninvasive studies and Dr. Kandis Cocking office and see [place] on an as-needed basis

## 2012-08-29 DIAGNOSIS — I350 Nonrheumatic aortic (valve) stenosis: Secondary | ICD-10-CM

## 2012-08-29 HISTORY — DX: Nonrheumatic aortic (valve) stenosis: I35.0

## 2013-02-26 DIAGNOSIS — Z9289 Personal history of other medical treatment: Secondary | ICD-10-CM

## 2013-02-26 HISTORY — DX: Personal history of other medical treatment: Z92.89

## 2013-03-04 ENCOUNTER — Other Ambulatory Visit (HOSPITAL_COMMUNITY): Payer: Self-pay | Admitting: Cardiovascular Disease

## 2013-03-04 ENCOUNTER — Telehealth (HOSPITAL_COMMUNITY): Payer: Self-pay | Admitting: Cardiovascular Disease

## 2013-03-04 DIAGNOSIS — I519 Heart disease, unspecified: Secondary | ICD-10-CM

## 2013-03-04 DIAGNOSIS — I255 Ischemic cardiomyopathy: Secondary | ICD-10-CM

## 2013-03-04 DIAGNOSIS — I25810 Atherosclerosis of coronary artery bypass graft(s) without angina pectoris: Secondary | ICD-10-CM

## 2013-03-04 DIAGNOSIS — I4510 Unspecified right bundle-branch block: Secondary | ICD-10-CM

## 2013-03-04 DIAGNOSIS — I701 Atherosclerosis of renal artery: Secondary | ICD-10-CM

## 2013-03-04 NOTE — Telephone Encounter (Signed)
Left message for patient to call back and schedule appts.

## 2013-03-05 ENCOUNTER — Telehealth (HOSPITAL_COMMUNITY): Payer: Self-pay | Admitting: Cardiovascular Disease

## 2013-03-05 NOTE — Telephone Encounter (Signed)
LEFT MESSAGE FOR PATIENT TO CALL BACK AND SCHEDULE APPTS

## 2013-03-14 ENCOUNTER — Encounter (HOSPITAL_COMMUNITY): Payer: Self-pay | Admitting: Emergency Medicine

## 2013-03-14 ENCOUNTER — Emergency Department (HOSPITAL_COMMUNITY): Payer: Medicare Other

## 2013-03-14 ENCOUNTER — Emergency Department (HOSPITAL_COMMUNITY)
Admission: EM | Admit: 2013-03-14 | Discharge: 2013-03-14 | Disposition: A | Discharge status: Home / Self Care | Payer: Medicare Other | Attending: Emergency Medicine | Admitting: Emergency Medicine

## 2013-03-14 DIAGNOSIS — M79609 Pain in unspecified limb: Secondary | ICD-10-CM | POA: Insufficient documentation

## 2013-03-14 DIAGNOSIS — Z951 Presence of aortocoronary bypass graft: Secondary | ICD-10-CM | POA: Insufficient documentation

## 2013-03-14 DIAGNOSIS — I2510 Atherosclerotic heart disease of native coronary artery without angina pectoris: Secondary | ICD-10-CM | POA: Insufficient documentation

## 2013-03-14 DIAGNOSIS — Z9889 Other specified postprocedural states: Secondary | ICD-10-CM | POA: Insufficient documentation

## 2013-03-14 DIAGNOSIS — Z8679 Personal history of other diseases of the circulatory system: Secondary | ICD-10-CM | POA: Insufficient documentation

## 2013-03-14 DIAGNOSIS — K3184 Gastroparesis: Secondary | ICD-10-CM | POA: Insufficient documentation

## 2013-03-14 DIAGNOSIS — M810 Age-related osteoporosis without current pathological fracture: Secondary | ICD-10-CM | POA: Insufficient documentation

## 2013-03-14 DIAGNOSIS — Z87891 Personal history of nicotine dependence: Secondary | ICD-10-CM | POA: Insufficient documentation

## 2013-03-14 DIAGNOSIS — Z7982 Long term (current) use of aspirin: Secondary | ICD-10-CM | POA: Insufficient documentation

## 2013-03-14 DIAGNOSIS — R110 Nausea: Secondary | ICD-10-CM | POA: Insufficient documentation

## 2013-03-14 DIAGNOSIS — M549 Dorsalgia, unspecified: Secondary | ICD-10-CM | POA: Diagnosis present

## 2013-03-14 DIAGNOSIS — I10 Essential (primary) hypertension: Secondary | ICD-10-CM | POA: Insufficient documentation

## 2013-03-14 DIAGNOSIS — Z7902 Long term (current) use of antithrombotics/antiplatelets: Secondary | ICD-10-CM | POA: Insufficient documentation

## 2013-03-14 DIAGNOSIS — I252 Old myocardial infarction: Secondary | ICD-10-CM | POA: Insufficient documentation

## 2013-03-14 DIAGNOSIS — Z79899 Other long term (current) drug therapy: Secondary | ICD-10-CM | POA: Insufficient documentation

## 2013-03-14 MED ORDER — DIAZEPAM 2 MG PO TABS
2.0000 mg | ORAL_TABLET | Freq: Once | ORAL | Status: AC
  Administered 2013-03-14: 2 mg via ORAL
  Filled 2013-03-14: qty 1

## 2013-03-14 MED ORDER — OXYCODONE HCL 5 MG PO TABS
2.5000 mg | ORAL_TABLET | Freq: Once | ORAL | Status: AC
  Administered 2013-03-14: 2.5 mg via ORAL
  Filled 2013-03-14: qty 1

## 2013-03-14 MED ORDER — OXYCODONE HCL 5 MG PO TABS: 2.5000 mg | ORAL_TABLET | Freq: Four times a day (QID) | ORAL | Status: DC | PRN

## 2013-03-14 NOTE — ED Provider Notes (Signed)
I saw and evaluated the patient, reviewed the resident's note and I agree with the findings and plan.  Subjective: Patient here with mid thoracic back pain without anginal type chest pain.  Objective: Patient afebrile vital signs stable. Patient's imaging here is negative  Assessment/plan: Patient with musculoskeletal back pain. No concern for dissection or angina. Pain is worse with movement and palpation. We'll treat symptomatically  Toy Baker, MD 03/14/13 1718

## 2013-03-14 NOTE — ED Notes (Signed)
Pt informed not oxycodone while taking tramadol. Pt told either take tramadol or oxycodone but not both.

## 2013-03-14 NOTE — ED Provider Notes (Signed)
History    CSN: [id number] Arrival date & time 03/14/13  1527  First MD Initiated Contact with Patient 03/14/13 1539     Chief Complaint  Patient presents with  . Back Pain   (Consider location/radiation/quality/duration/timing/severity/associated sxs/prior Treatment) Patient is a 79 y.o. female presenting with back pain. The history is provided by the patient.  Back Pain Location:  Thoracic spine (Mid, "between my scapulas") Quality:  Aching (and dull) Radiates to:  Does not radiate Pain severity:  Severe Pain is:  Same all the time Onset quality:  Sudden Duration:  5 hours Timing:  Constant Progression:  Unchanged Chronicity:  New Context: not falling, not jumping from heights, not recent illness and not recent injury   Context comment:  "I was bending over" Relieved by:  Nothing Worsened by:  Nothing tried Ineffective treatments: tramadol, tylenol. Associated symptoms: leg pain (unchanged from baseline, related to known PVD)   Associated symptoms: no abdominal pain, no chest pain, no fever, no headaches and no weakness   Risk factors: hx of osteoporosis and vascular disease (s/o aortibifemoral bypass)    Past Medical History  Diagnosis Date  . Gastroparesis   . Coronary artery disease   . Myocardial infarction   . Hypertension   . Hyperlipidemia   . Carotid artery occlusion    Past Surgical History  Procedure Laterality Date  . Coronary artery bypass graft  2000  . Colon surgery  2004    r/t blockage  . Carotid endarterectomy      left  . Pr vein bypass graft,aorto-fem-pop      right leg   Family History  Problem Relation Age of Onset  . Diabetes Mother   . Heart disease Father   . Heart disease Sister   . Heart disease Brother   . Heart attack Brother    History  Substance Use Topics  . Smoking status: Former Smoker -- 1.00 packs/day for 20 years    Types: Cigarettes    Quit date: 05/03/1986  . Smokeless tobacco: Never Used  . Alcohol Use: No    OB History   Grav Para Term Preterm Abortions TAB SAB Ect Mult Living                 Review of Systems  Constitutional: Negative for fever, chills, diaphoresis, activity change and appetite change.  HENT: Negative for sore throat, rhinorrhea, sneezing, drooling and trouble swallowing.   Eyes: Negative for discharge and redness.  Respiratory: Negative for cough, chest tightness, shortness of breath, wheezing and stridor.   Cardiovascular: Negative for chest pain and leg swelling.  Gastrointestinal: Positive for nausea (for > 2 weeks). Negative for vomiting, abdominal pain, diarrhea, constipation and blood in stool.  Genitourinary: Negative for difficulty urinating.  Musculoskeletal: Positive for back pain. Negative for myalgias.  Skin: Negative for pallor.  Neurological: Negative for dizziness, syncope, speech difficulty, weakness, light-headedness and headaches.  Hematological: Negative for adenopathy. Does not bruise/bleed easily.  Psychiatric/Behavioral: Negative for confusion and agitation.    Allergies  Oxycodone; Contrast media; Iohexol; Promethazine; and Vicodin  Home Medications   Current Outpatient Rx  Name  Route  Sig  Dispense  Refill  . acetaminophen (TYLENOL) 500 MG tablet   Oral   Take 1,000 mg by mouth every 8 (eight) hours.          . ALPRAZolam (XANAX) 0.25 MG tablet   Oral   Take 0.5 mg by mouth at bedtime as needed for sleep.         Marland Kitchen  aspirin 81 MG tablet   Oral   Take 81 mg by mouth every morning.          Marland Kitchen atorvastatin (LIPITOR) 20 MG tablet   Oral   Take 20 mg by mouth every morning.          . Biotin 5000 MCG TABS   Oral   Take 5,000 mcg by mouth every evening.          . Calcium Carb-Cholecalciferol (CALCIUM-VITAMIN D3) 600-500 MG-UNIT CAPS   Oral   Take 1 tablet by mouth 2 (two) times daily.         . cilostazol (PLETAL) 100 MG tablet   Oral   Take 100 mg by mouth 2 (two) times daily.           . clopidogrel (PLAVIX)  75 MG tablet   Oral   Take 75 mg by mouth daily.          . DULoxetine (CYMBALTA) 60 MG capsule   Oral   Take 60 mg by mouth every evening.          . fenofibrate 160 MG tablet   Oral   Take 160 mg by mouth every morning.          . isosorbide mononitrate (IMDUR) 30 MG 24 hr tablet   Oral   Take 30 mg by mouth daily at 6 PM.          . lidocaine (LIDODERM) 5 %   Transdermal   Place 2 patches onto the skin every evening. Remove & Discard patch within 12 hours or as directed by MD.         . losartan (COZAAR) 50 MG tablet   Oral   Take 50 mg by mouth every morning.          . metoprolol tartrate (LOPRESSOR) 25 MG tablet   Oral   Take 25 mg by mouth 2 (two) times daily.           . Omega-3 Fatty Acids (FISH OIL) 1000 MG CAPS   Oral   Take 1 capsule by mouth every morning.          . pantoprazole (PROTONIX) 40 MG tablet   Oral   Take 40 mg by mouth 2 (two) times daily.           . polyethylene glycol (MIRALAX / GLYCOLAX) packet   Oral   Take 17-34 g by mouth every morning.          . pregabalin (LYRICA) 100 MG capsule   Oral   Take 100 mg by mouth 3 (three) times daily.           . sucralfate (CARAFATE) 1 G tablet   Oral   Take 1 g by mouth 2 (two) times daily.          . temazepam (RESTORIL) 15 MG capsule   Oral   Take 15 mg by mouth at bedtime as needed for sleep.          . traMADol (ULTRAM) 50 MG tablet   Oral   Take 100 mg by mouth every 6 (six) hours.           BP 106/58  Pulse 71  Temp(Src) 98.5 F (36.9 C) (Oral)  Resp 18  SpO2 98% Physical Exam  Constitutional: She is oriented to person, place, and time. She appears well-developed and well-nourished. No distress.  HENT:  Head: Normocephalic and atraumatic.  Right  Ear: External ear normal.  Left Ear: External ear normal.  Eyes: Conjunctivae and EOM are normal. Right eye exhibits no discharge. Left eye exhibits no discharge.  Neck: Normal range of motion. Neck  supple. No JVD present.  Cardiovascular: Normal rate, regular rhythm and normal heart sounds.  Exam reveals no gallop and no friction rub.   No murmur heard. Pulmonary/Chest: Effort normal and breath sounds normal. No stridor. No respiratory distress. She has no wheezes. She has no rales. She exhibits no tenderness.  Abdominal: Soft. Bowel sounds are normal. She exhibits no distension. There is no tenderness. There is no rebound and no guarding.  Musculoskeletal: Normal range of motion. She exhibits no edema.  Neurological: She is alert and oriented to person, place, and time.  Skin: Skin is warm. No rash noted. She is not diaphoretic.  Psychiatric: She has a normal mood and affect. Her behavior is normal.    ED Course  Procedures (including critical care time) Labs Reviewed - No data to display Dg Chest 2 View  03/14/2013   *RADIOLOGY REPORT*  Clinical Data: Back pain.  Chest pain.  Hypertension.  CHEST - 2 VIEW  Comparison: 04/10/2012  Findings: Status post median sternotomy and CABG procedure.  There is mild cardiac enlargement.  There is no airspace consolidation identified.  IMPRESSION:  1.  Mild cardiac enlargement. 2.  No acute cardiopulmonary abnormalities.   Original Report Authenticated By: Signa Kell, M.D.   Dg Thoracic Spine 2 View  03/14/2013   *RADIOLOGY REPORT*  Clinical Data: Back pain.  THORACIC SPINE - 2 VIEW  Comparison: PA and lateral chest earlier this same date.  Findings: No fracture or subluxation is identified.  Intervertebral disc space height is maintained.  Paraspinous structures demonstrate postoperative change of CABG.  Surgical clips are also seen in the base of the right neck and right upper quadrant of the abdomen.  IMPRESSION: Normal-appearing thoracic spine.   Original Report Authenticated By: Holley Dexter, M.D.   No diagnosis found.  MDM  Manning Charity 79 y.o. presents with acute onset back pain after bending over. Pain is positional and  reproducible. AFVSS. AOx4. Strength in UE and LE intact and symmetric bilaterally. Point TTP upper thoracic midline spine. No focal deficits on exam. CXR similar to previous and no mediastinal widening. HDS. Doubt dissection. Thoracic spine XR neg for acute fracture or malalignment. Pain controlled in ED. DC'd home. Instructed to fu with PCP in 2-3 days for re evaluation. Imaging reviewed. I discussed this patient's care with my attending, Dr. Freida Busman.   Sena Hitch, MD 03/15/13 [phone]

## 2013-03-14 NOTE — ED Notes (Signed)
Pt to ED via EMS with c/o right shoulder blames pain with nausea, onset today at 1100. Pt also c/o left arm and leg coolness and tingling. Per EMS, EKG unremarkable, BP-150/70, O2-97% room, HR-69.

## 2013-03-15 ENCOUNTER — Ambulatory Visit (HOSPITAL_COMMUNITY)
Admission: RE | Admit: 2013-03-15 | Discharge: 2013-03-15 | Disposition: A | Discharge status: Home / Self Care | Payer: Medicare Other | Source: Ambulatory Visit | Attending: Cardiovascular Disease | Admitting: Cardiovascular Disease

## 2013-03-15 DIAGNOSIS — I255 Ischemic cardiomyopathy: Secondary | ICD-10-CM

## 2013-03-15 DIAGNOSIS — I701 Atherosclerosis of renal artery: Secondary | ICD-10-CM | POA: Insufficient documentation

## 2013-03-15 DIAGNOSIS — I2510 Atherosclerotic heart disease of native coronary artery without angina pectoris: Secondary | ICD-10-CM | POA: Insufficient documentation

## 2013-03-15 DIAGNOSIS — I4510 Unspecified right bundle-branch block: Secondary | ICD-10-CM | POA: Insufficient documentation

## 2013-03-15 DIAGNOSIS — I452 Bifascicular block: Secondary | ICD-10-CM | POA: Insufficient documentation

## 2013-03-15 DIAGNOSIS — I25810 Atherosclerosis of coronary artery bypass graft(s) without angina pectoris: Secondary | ICD-10-CM

## 2013-03-15 DIAGNOSIS — I700 Atherosclerosis of aorta: Secondary | ICD-10-CM | POA: Insufficient documentation

## 2013-03-15 DIAGNOSIS — I428 Other cardiomyopathies: Secondary | ICD-10-CM | POA: Insufficient documentation

## 2013-03-15 DIAGNOSIS — I739 Peripheral vascular disease, unspecified: Secondary | ICD-10-CM | POA: Insufficient documentation

## 2013-03-15 NOTE — Progress Notes (Signed)
Carotid duplex complete. GMG 

## 2013-03-15 NOTE — Progress Notes (Signed)
Betty Floyd   2D echo completed 03/15/2013.   Cindy Aidaly Cordner, RDCS  

## 2013-03-16 NOTE — ED Provider Notes (Signed)
I saw and evaluated the patient, reviewed the resident's note and I agree with the findings and plan.  Asiah Browder T Kiaan Overholser, MD 03/16/13 1108 

## 2013-03-22 ENCOUNTER — Ambulatory Visit (HOSPITAL_COMMUNITY)
Admission: RE | Admit: 2013-03-22 | Discharge: 2013-03-22 | Disposition: A | Discharge status: Home / Self Care | Payer: Medicare Other | Source: Ambulatory Visit | Attending: Cardiovascular Disease | Admitting: Cardiovascular Disease

## 2013-03-22 DIAGNOSIS — I4510 Unspecified right bundle-branch block: Secondary | ICD-10-CM | POA: Insufficient documentation

## 2013-03-22 DIAGNOSIS — I701 Atherosclerosis of renal artery: Secondary | ICD-10-CM

## 2013-03-22 DIAGNOSIS — I2510 Atherosclerotic heart disease of native coronary artery without angina pectoris: Secondary | ICD-10-CM | POA: Insufficient documentation

## 2013-03-22 DIAGNOSIS — I25810 Atherosclerosis of coronary artery bypass graft(s) without angina pectoris: Secondary | ICD-10-CM

## 2013-03-22 DIAGNOSIS — I700 Atherosclerosis of aorta: Secondary | ICD-10-CM | POA: Insufficient documentation

## 2013-03-22 NOTE — Progress Notes (Signed)
Renal Artery Duplex Completed. °Betty Floyd ° °

## 2013-03-22 NOTE — Progress Notes (Signed)
Lower Extremity Arterial Duplex Completed. °Betty Floyd ° °

## 2013-03-29 ENCOUNTER — Telehealth: Payer: Self-pay | Admitting: Cardiovascular Disease

## 2013-03-29 NOTE — Telephone Encounter (Signed)
Talked to pt. And gave details of pt. s test results

## 2013-03-29 NOTE — Telephone Encounter (Signed)
Wanting results on test that she took on las Friday and have questions .Marland Kitchen Please call.Marland Kitchen

## 2013-06-11 ENCOUNTER — Telehealth: Payer: Self-pay | Admitting: Cardiovascular Disease

## 2013-06-11 NOTE — Telephone Encounter (Signed)
Please have J C to call her-She does not want to talk to anybody but J C.

## 2013-06-11 NOTE — Telephone Encounter (Signed)
i have talked to this pt and answered her questions

## 2013-06-11 NOTE — Telephone Encounter (Signed)
Message forwarded to J.C. Wildman, LPN.  

## 2013-08-01 ENCOUNTER — Encounter: Payer: Self-pay | Admitting: Cardiology

## 2013-08-06 ENCOUNTER — Encounter: Payer: Self-pay | Admitting: Cardiology

## 2013-08-06 ENCOUNTER — Ambulatory Visit (INDEPENDENT_AMBULATORY_CARE_PROVIDER_SITE_OTHER): Payer: Medicare Other | Admitting: Cardiology

## 2013-08-06 VITALS — BP 120/50 | HR 66 | Ht 65.0 in | Wt 151.9 lb

## 2013-08-06 DIAGNOSIS — I701 Atherosclerosis of renal artery: Secondary | ICD-10-CM

## 2013-08-06 DIAGNOSIS — I771 Stricture of artery: Secondary | ICD-10-CM

## 2013-08-06 DIAGNOSIS — Z8679 Personal history of other diseases of the circulatory system: Secondary | ICD-10-CM

## 2013-08-06 DIAGNOSIS — Z951 Presence of aortocoronary bypass graft: Secondary | ICD-10-CM

## 2013-08-06 DIAGNOSIS — I2510 Atherosclerotic heart disease of native coronary artery without angina pectoris: Secondary | ICD-10-CM

## 2013-08-06 DIAGNOSIS — I779 Disorder of arteries and arterioles, unspecified: Secondary | ICD-10-CM

## 2013-08-06 DIAGNOSIS — I10 Essential (primary) hypertension: Secondary | ICD-10-CM

## 2013-08-06 DIAGNOSIS — I739 Peripheral vascular disease, unspecified: Secondary | ICD-10-CM

## 2013-08-06 DIAGNOSIS — I708 Atherosclerosis of other arteries: Secondary | ICD-10-CM

## 2013-08-06 DIAGNOSIS — E785 Hyperlipidemia, unspecified: Secondary | ICD-10-CM

## 2013-08-06 NOTE — Progress Notes (Signed)
PATIENT: Betty Floyd MRN: [id number] DOB: 02-02-1933 PCP: Kari Baars, MD  Clinic Note: Chief Complaint  Patient presents with  . ROV 8 months RAW-DH    C/o leg pain-rt leg worse, edema, and occas lightheadedness    HPI: Betty Floyd is a 79 y.o. female with very complex PMH as described below who presents today for establishment of new cardiologist at the retirement of Dr. Alanda Amass. She's been Dr. Alanda Amass patient for well over 30 years. Her CAD history dates back to 53 when she had an MI. I was treated medically. From a cardiac standpoint has actually been relatively stable since 2004 when she had native circumflex stents placed for progression of native circumflex disease in the setting of an occluded radial graft to an obtuse marginal. She also has extensive peripheral vascular disease the Dr. Alanda Amass as to following along with Doctors Early and Edilia Bo from Vascular Surgery. She last saw Dr. Alanda Amass in March of this year. Since then she has had followup Dopplers of her carotids abdomen with SMA and renal evaluation as well as lower extremity Dopplers. She also had an echocardiogram performed. The results are noted in the history section.  Interval History: She possess a day do it relatively well. She does note persistent claudication symptoms in the right leg which has been getting a bit worse. Previously she did notice some pain in her left foot as well. She is not having resting pain is yet however. She denies any severe ulcers or wounds or lesions. Not noted on exam either. Her activity is mostly limited by her claudication and back pain. She can walk around the house without trouble but then much more than that she would give out mostly because of the claudication first. With the mastectomy she does, she denies any chest tightness or pressure. She denies any dyspnea either. She has no PND, orthopnea or edema. She denies any palpitations or rapid heartbeats/irregular  heartbeats. She denies any subclavian steal phenomenon symptoms with use of her left arm. She denies any lightheadedness, dizziness or wooziness with exception of some mild positional orthostasis. She denies any syncope or near-syncope. No TIA or amaurosis fugax symptoms. She has had some blood in the stool has been evaluated by Dr. Jake Shark. His thought that she had a perforated varix. She temporarily stopped Plavix for several days, and she subsequently stopped bleeding. Since then no melena, hematochezia or hematuria.   she is regularly followed by Dr. Clelia Croft, who monitors her lipids.  Past Medical History  Diagnosis Date  . Posterior MI 1976    While on OCPs; age 89  . CAD, multiple vessel Progression until 2000    Referred for CABG x3 by Dr. Donata Clay  . S/P CABG x 3 2000    LIMA-LAD, SVG-RPDA, LRad-OM (known to be occluded)  . H/O unstable angina February 2004    Diagnostic cath showed severe circumflex disease with occluded radial graft to OM  . CAD (coronary artery disease) of artery bypass graft February 2004     Occluded radial-OM graft; DES PCI proximal and mid Circumflex (mid: 2.5 mm 18 mm Cypher DES, proximal 2.5 mm x 13 mm Cypher DES)  . S/P cardiac catheterization  May 2010    Patent pedicled LIMA-LAD, patent SVG-PDA, patent circumflex stents. Also patent left carotid-left subclavian bypass;; apical and distal inferior posterior apical hypokinesis  . H/O echocardiogram July 2014    EF 50-55%; mild LVH. Grade 1 diastolic dysfunction. Moderate HJ of inferolateral wall. Mild  AI. Mild MR.   Marland Kitchen History of nuclear stress test  September 2012    LexiScan Myoview: Large size, moderate-severe intensity defect in the basal inferoseptal, basal inferior walls as well as basal inferolateral mid inferolateral/apical lateral walls with mild reversibility. Thought to be mild hypokinesis in this distribution but inconsistent with size of defect. Would suggest viable myocardium  . Carotid artery  disease -left Most recent Dopplers July 2014    History of left carotid angioplasty (Dr. Edilia Bo); carotid Dopplers July 2014: Bilateral ICAs less than 49% reduction. Patent left common carotid-subclavian artery bypass  . Subclavian artery stenosis, left Most recent Dopplers 2014     100% occlusion; status post left carotid-subclavian bypass (no evidence of steal phenomenon by cath in 2010, with adequate LIMA flow)  . Right renal artery stenosis 2004; most recent Doppler July 2014    PCI: Genesis 6.0 mm x 12 mm; abdominal vessel Dopplers July 2014: Right proximal renal artery at stent equal to or less than 60%; left proximal renal artery less than 50%. Right and left kidneys are equal size and symmetric. Left lower kidney pole with a 0.7 cm x 2.9 centimeter cyst  . Occlusive mesenteric ischemia  2007; Doppler July 2014    BMS - SMA; Dr. Fredia Sorrow (VIR); followup Dopplers July 2014: SMA and celiac arteries flow velocities consistent with greater than 60% diameter reduction  . PAD (peripheral artery disease) Most recent Doppler July 2014     extensive bilateral disease; R>L; Multiple bypass operations: AortoBifem (1998), R Fem-Pop; R Fem-tib; Fem-Fem -- occluded; 03/17/2013  Dopplerrs: R. ABI 0; left ABI 0.06. Right CFA, SFA-, femoropopliteal, fem-tib occluded; R. PT, AT-DP occluded.  LEFT distal EIA high resistance, CFA<50%,  peroneal not visualized -  progression of L. ABI (followup 6 months)   . Hypertension      partially renovascular  . Hyperlipidemia LDL goal <70   . Gastroparesis   . Peptic ulcer disease   . Osteoarthritis of back      and Knees  . Chronic back pain   . Aortic valve stenosis, moderate 2014    Calcific stenosis with increased velocities in the mid to distal aorta consistent with the greater than 60% diameter reduction.  . Small bowel obstruction due to adhesions October 2004    Status post LOA  . Gastritis     Prior Cardiac Evaluation and Past Surgical History: Past  Surgical History  Procedure Laterality Date  . Coronary artery bypass graft  2000    LIMA-LAD, SVG-RPDA, left radial-OM  . Colon surgery  2004    r/t blockage  . Carotid endarterectomy Left     Dr. Arbie Cookey  . Pr vein bypass graft,aorto-fem-pop  1998    right leg  . Carotid artery - subclavian artery bypass graft Left     Dr. Edilia Bo  . Renal artery stent Right 2004    Dr. Alanda Amass 6 mm x 12 mm Genesis  . Superior mestenteric artery stent  2007    Dr. Fredia Sorrow  . Coronary stent placement  2004    (for occluded radial-OM graft) Proximal circumflex 2.5 mm x 13 mm Cypher DES; mid circumflex 2.5 mm x 18 mm Cypher DES  . Cardiac catheterization  2010    Patent LIMA-LAD with good distal flow to diagonals; patent SVG-RPDA, patent circumflex stents. Patent left carotid-subclavian bypass with patent LIMA-LAD  . Laparoscopic lysis of adhesions  October 2004  . Aortobifem  1998    Dr. Micah Noel  . Femoral-tibial  bypass graft Left     Allergies  Allergen Reactions  . Oxycodone Other (See Comments)    Causes Hallucinations makes her go crazy.  Patient seems to think that she can take med very short term.   . Contrast Media [Iodinated Diagnostic Agents] Hives, Itching and Rash    Benadryl works to counteract reaction  . Iohexol Hives, Itching and Rash    01-11-06---pt had erythema & mild itching with 13 hr prep, Onset Date: [id number]   . Promethazine Hives, Itching and Rash  . Vicodin [Hydrocodone-Acetaminophen] Hives, Itching and Rash    Current Outpatient Prescriptions  Medication Sig Dispense Refill  . acetaminophen (TYLENOL) 500 MG tablet Take 1,000 mg by mouth every 8 (eight) hours.       . ALPRAZolam (XANAX) 0.25 MG tablet Take 0.5 mg by mouth at bedtime as needed for sleep.      Marland Kitchen aspirin 81 MG tablet Take 81 mg by mouth every morning.       Marland Kitchen atorvastatin (LIPITOR) 20 MG tablet Take 20 mg by mouth every morning.       . Calcium Carb-Cholecalciferol (CALCIUM-VITAMIN D3) 600-500  MG-UNIT CAPS Take 1 tablet by mouth 2 (two) times daily.      . cilostazol (PLETAL) 100 MG tablet Take 100 mg by mouth 2 (two) times daily.        . clopidogrel (PLAVIX) 75 MG tablet Take 75 mg by mouth daily.       . DULoxetine (CYMBALTA) 60 MG capsule Take 60 mg by mouth every evening.       . fenofibrate 160 MG tablet Take 160 mg by mouth every morning.       . isosorbide mononitrate (IMDUR) 30 MG 24 hr tablet Take 30 mg by mouth daily at 6 PM.       . lidocaine (LIDODERM) 5 % Place 3 patches onto the skin every evening. Remove & Discard patch within 12 hours or as directed by MD.      . losartan (COZAAR) 50 MG tablet Take 50 mg by mouth every morning.       . metoprolol tartrate (LOPRESSOR) 25 MG tablet Take 25 mg by mouth 2 (two) times daily.        . Omega-3 Fatty Acids (FISH OIL) 1000 MG CAPS Take 1 capsule by mouth every morning.       Marland Kitchen oxyCODONE (OXY IR/ROXICODONE) 5 MG immediate release tablet Take 5 mg by mouth every 6 (six) hours as needed for pain.      . pantoprazole (PROTONIX) 40 MG tablet Take 40 mg by mouth 2 (two) times daily.        . polyethylene glycol (MIRALAX / GLYCOLAX) packet Take 17-34 g by mouth every morning.       . pregabalin (LYRICA) 100 MG capsule Take 100 mg by mouth 3 (three) times daily.        . sucralfate (CARAFATE) 1 G tablet Take 1 g by mouth 2 (two) times daily.       . temazepam (RESTORIL) 15 MG capsule Take 15 mg by mouth at bedtime as needed for sleep.       . traMADol (ULTRAM) 50 MG tablet Take 100 mg by mouth every 6 (six) hours.        No current facility-administered medications for this visit.    History   Social History Narrative   Divorced white woman. Mother of 2. Accompanied by her daughter who usually accompanies her. As live with  her now for just over 2 years.   She quit smoking in 1987 after greater than 20 years of one pack per day.   She denies any alcohol use.   She is in Lipitor he around the house, but does not walk much due to a  combination of significant claudication mostly in her right foot but now also on her left. She also has significant back related pain. This limits her activities.   Family History: family history includes Diabetes in her mother; Heart attack in her brother; Heart disease in her brother, father, and sister.  ROS: A comprehensive Review of Systems - Negative except Pertinent positives noted above most of the claudication symptoms. She also has significant severe back pain with 2 disc space disease. There is no plans for any operations of this. She also notes mild lower STEMI edema which is relatively chronic for her and not troublesome. She has intentionally lost about 6 pounds since her last visit.  PHYSICAL EXAM BP 120/50  Pulse 66  Ht 5\' 5"  (1.651 m)  Wt 151 lb 14.4 oz (68.901 kg)  BMI 25.28 kg/m2 General appearance: alert and oriented x3, cooperative, appears older than stated age, no distress; chronically ill appearing. Normal mood and affect HEENT: [place]/AT, EOMI, MMM, anicteric sclera Neck: no adenopathy, no JVD, supple, symmetrical, trachea midline, thyroid not enlarged, symmetric;; Bilateral bruits also heard in the subclavian region. But brisk carotid upstroke. Lungs: clear to auscultation bilaterally, normal percussion bilaterally and nonlabored, good air movement Heart: prominent apical impulse and regular rate and rhythm; normal S1 and S2 with an S4 gallop. Soft 2/6 SEM. Otherwise no HSM. Abdomen: soft, non-tender; bowel sounds normal; no masses,  no organomegaly Extremities: no ulcers, gangrene or trophic changes; mild rubor noted but nothing significant. 2 right foot is a little bit cooler to touch than the left. No mottled appearance Pulses: Left Popliteal present 1+ Right Popliteal absent Left Dorsalis Pedis present 1+ Right Dorsalis Pedis barely palpable Left Posterior Tibial present 1+ Right Posterior Tibial absent Neurologic: Grossly normal  [id number] today: Yes Rate:  66 , Rhythm: NSR;  mostly normal, with inferior Q waves and nonspecific T wave changes.  Recent Labs: No recent labs in Epic. Lipids are followed by PCP.  ASSESSMENT / PLAN: Very complex and difficult patient, who does seem to do relatively stable overall. She has extensive coronary disease as well as vascular disease. I am simply getting to know her now. She seems relatively stable I would not make any medication changes. I did tell her that if she were to have more GI bleed he'll be fine for her to stop her Plavix for 5-7 days in order to help the bleeding stop.  CAD, multiple vessel Thankfully, she doesn't seem to be overly symptomatic from a cardiac standpoint. She had her last nuclear stress test in 2012. Either in the upcoming year or the year following we'll need to do a followup Myoview to assess stent and graft patency. Mostly mucus she doesn't walk enough to develop symptoms of muscle rest.  She remains under very good regimen with aspirin and Plavix along with statin, beta blocker and ARB. She also then has omega-3 fatty acids and fenofibrate for her lipids.  Peripheral vascular occlusive disease She does note claudication on the right leg but was also having some issues with the left. She had pretty significant progression of disease in the left external iliac will follow that back up again after her upcoming visit. We may need  to consider who will be responsible for further treatment of these lesions. Says she has a close working relationship with vascular surgery, my inclination would be to have him followup her Doppler results. She is on the great cardiac regimen as noted above in addition she is on Pletal. I am not sure how many more options there are for revascularization, especially in her right lower extremity. However the external iliac on the left could be a potential target. Unfortunately her distal no house significant medical claudication on the left as compared to the  right.  Right renal artery stenosis Moderate in-stent restenosis. Her well-controlled blood pressure for now would just continue to monitor with annual followup Dopplers in July of next year..  Subclavian artery stenosis, left This is a crucial situation with the carotid to subclavian bypass being extremely vital for the patency of the LIMA graft. Will continue to follow this in the carotid vessels for now. However we'll defer treatment to her vascular surgery team. All discussed with them how they want to proceed with continued followup of Dopplers now to Dr. Alanda Amass has retired.  H/O unstable angina Thankfully, she is not had any anginal symptoms of late. She is a very good regimen with the addition of a isosorbide mononitrate.    Hypertension Well-controlled on current regimen.  Hyperlipidemia LDL goal <70 She is on 3 medications for lipids. This is followed by Dr. Clelia Croft. I not sure when the last time labs were checked.   Orders Placed This Encounter  Procedures  . EKG 12-Lead   Meds ordered this encounter  Medications  . oxyCODONE (OXY IR/ROXICODONE) 5 MG immediate release tablet    Sig: Take 5 mg by mouth every 6 (six) hours as needed for pain.    Followup: 3 months  DAVID W. Herbie Baltimore, M.D., M.S. [facility] [address] [place], [place]  [address]  [phone] Pager # [phone]

## 2013-08-06 NOTE — Patient Instructions (Signed)
Your physician wants you to follow-up in 3-4 months Dr Herbie Baltimore. You will receive a reminder letter in the mail two months in advance. If you don't receive a letter, please call our office to schedule the follow-up appointment. CONTINUE WITH CURRENT MEDICATION.

## 2013-08-07 DIAGNOSIS — I2510 Atherosclerotic heart disease of native coronary artery without angina pectoris: Secondary | ICD-10-CM | POA: Insufficient documentation

## 2013-08-07 DIAGNOSIS — Z951 Presence of aortocoronary bypass graft: Secondary | ICD-10-CM | POA: Insufficient documentation

## 2013-08-07 DIAGNOSIS — I10 Essential (primary) hypertension: Secondary | ICD-10-CM | POA: Insufficient documentation

## 2013-08-07 DIAGNOSIS — I701 Atherosclerosis of renal artery: Secondary | ICD-10-CM | POA: Insufficient documentation

## 2013-08-07 DIAGNOSIS — E785 Hyperlipidemia, unspecified: Secondary | ICD-10-CM | POA: Insufficient documentation

## 2013-08-07 DIAGNOSIS — I771 Stricture of artery: Secondary | ICD-10-CM | POA: Insufficient documentation

## 2013-08-07 NOTE — Assessment & Plan Note (Signed)
Thankfully, she is not had any anginal symptoms of late. She is a very good regimen with the addition of a isosorbide mononitrate.

## 2013-08-07 NOTE — Assessment & Plan Note (Signed)
She does note claudication on the right leg but was also having some issues with the left. She had pretty significant progression of disease in the left external iliac will follow that back up again after her upcoming visit. We may need to consider who will be responsible for further treatment of these lesions. Says she has a close working relationship with vascular surgery, my inclination would be to have him followup her Doppler results. She is on the great cardiac regimen as noted above in addition she is on Pletal. I am not sure how many more options there are for revascularization, especially in her right lower extremity. However the external iliac on the left could be a potential target. Unfortunately her distal no house significant medical claudication on the left as compared to the right.

## 2013-08-07 NOTE — Assessment & Plan Note (Signed)
Thankfully, she doesn't seem to be overly symptomatic from a cardiac standpoint. She had her last nuclear stress test in 2012. Either in the upcoming year or the year following we'll need to do a followup Myoview to assess stent and graft patency. Mostly mucus she doesn't walk enough to develop symptoms of muscle rest.  She remains under very good regimen with aspirin and Plavix along with statin, beta blocker and ARB. She also then has omega-3 fatty acids and fenofibrate for her lipids.

## 2013-08-07 NOTE — Assessment & Plan Note (Signed)
She is on 3 medications for lipids. This is followed by Dr. Clelia Croft. I not sure when the last time labs were checked.

## 2013-08-07 NOTE — Assessment & Plan Note (Signed)
This is a crucial situation with the carotid to subclavian bypass being extremely vital for the patency of the LIMA graft. Will continue to follow this in the carotid vessels for now. However we'll defer treatment to her vascular surgery team. All discussed with them how they want to proceed with continued followup of Dopplers now to Dr. Alanda Amass has retired.

## 2013-08-07 NOTE — Assessment & Plan Note (Signed)
Well-controlled on current regimen. ?

## 2013-08-07 NOTE — Assessment & Plan Note (Signed)
Moderate in-stent restenosis. Her well-controlled blood pressure for now would just continue to monitor with annual followup Dopplers in July of next year.Marland Kitchen

## 2013-10-18 ENCOUNTER — Encounter: Payer: Self-pay | Admitting: *Deleted

## 2013-12-09 ENCOUNTER — Ambulatory Visit: Payer: Medicare Other | Admitting: Cardiology

## 2014-01-16 ENCOUNTER — Encounter: Payer: Self-pay | Admitting: Cardiology

## 2014-01-16 ENCOUNTER — Ambulatory Visit (INDEPENDENT_AMBULATORY_CARE_PROVIDER_SITE_OTHER): Payer: Medicare Other | Admitting: Cardiology

## 2014-01-16 VITALS — BP 150/62 | HR 71 | Ht 65.0 in | Wt 154.8 lb

## 2014-01-16 DIAGNOSIS — Z951 Presence of aortocoronary bypass graft: Secondary | ICD-10-CM

## 2014-01-16 DIAGNOSIS — I779 Disorder of arteries and arterioles, unspecified: Secondary | ICD-10-CM

## 2014-01-16 DIAGNOSIS — E785 Hyperlipidemia, unspecified: Secondary | ICD-10-CM

## 2014-01-16 DIAGNOSIS — I10 Essential (primary) hypertension: Secondary | ICD-10-CM

## 2014-01-16 DIAGNOSIS — I708 Atherosclerosis of other arteries: Secondary | ICD-10-CM

## 2014-01-16 DIAGNOSIS — I771 Stricture of artery: Secondary | ICD-10-CM

## 2014-01-16 DIAGNOSIS — I70219 Atherosclerosis of native arteries of extremities with intermittent claudication, unspecified extremity: Secondary | ICD-10-CM

## 2014-01-16 DIAGNOSIS — I739 Peripheral vascular disease, unspecified: Secondary | ICD-10-CM

## 2014-01-16 DIAGNOSIS — I2510 Atherosclerotic heart disease of native coronary artery without angina pectoris: Secondary | ICD-10-CM

## 2014-01-16 DIAGNOSIS — I701 Atherosclerosis of renal artery: Secondary | ICD-10-CM

## 2014-01-16 NOTE — Patient Instructions (Signed)
Continue current medication  Your physician wants you to follow-up in 12 months DR HARDING.  You will receive a reminder letter in the mail two months in advance. If you don't receive a letter, please call our office to schedule the follow-up appointment.  

## 2014-01-18 NOTE — Assessment & Plan Note (Signed)
She will be due to have followup carotids. They have been stable of late. I think maybe checking them every other year is reasonable. She just simply asked why she needs to have annual checks.

## 2014-01-18 NOTE — Assessment & Plan Note (Signed)
Table blood pressure. In myelination would be that if there was some signs of stenosis, her blood pressure would probably be higher.

## 2014-01-18 NOTE — Assessment & Plan Note (Signed)
Relatively well-controlled. A little high today, my examination would be to monitor this to the blood pressure and only adjusted to a significant increase or decrease his.

## 2014-01-18 NOTE — Assessment & Plan Note (Addendum)
No active cardiac symptoms. She is somewhat limited in sports activity level. Therefore she may not be quite pushing herself enough to have symptoms. She is on statin aspirin nitrates ARB beta blocker and tolerating them well.  Plan will be to discuss possibility of followup stress test next year.  Based on her lack of desire to have vascular ultrasound done, it not sure that she would be agreeable to having stress test done next year.

## 2014-01-18 NOTE — Assessment & Plan Note (Signed)
Again followup Dopplers her patient request we'll not be done this year unless they ordered without my knowing.

## 2014-01-18 NOTE — Progress Notes (Signed)
PATIENT: Betty Floyd MRN: [id number] DOB: 1933/05/25 PCP: Martha Clan, MD  Clinic Note: Chief Complaint  Patient presents with  . 5 MONTH VISIT    CHSET PAIN , NO SOB , NO EDEMA   HPI: Betty Floyd is a 80 y.o. female with very complex PMH as described below who presents today for her initial followup visit after establishment of new cardiologist at the retirement of Dr. Alanda Amass in December of last year. She's been Dr. Alanda Amass patient for well over 30 years he followup for her CAD dating back to an MI in 1976 treated medically as well as PAD followed by Dr. Alanda Amass but covered by Dr. Edilia Bo and Dr. Darrick Penna from vascular surgery.  Her last cardiac episode was back in 2004 -- she had native circumflex stents placed for progression of native circumflex disease in the setting of an occluded radial graft to an obtuse marginal.   Interval History: She presents today relatively well. She really hasn't had any complaints of chest pain or dyspnea with routine activities. She has to use a walker around the house because her legs gave out on her secondary to back pain before she has any symptoms. No PND, orthopnea or significant edema. She has had couple episodes of what were thought to be hemorrhoidal his brother was no melena, hematochezia or hematuria.  She denies any rapid or irregular heartbeat/palpitations. No syncope /near syncope, TIA or amaurosis fugax.  Persistent right leg claudication She is regularly followed by Dr. Clelia Croft, who monitors her lipids.  Past Medical History  Diagnosis Date  . Posterior MI 1976    While on OCPs; age 49  . CAD, multiple vessel Progression until 2000    Referred for CABG x3 by Dr. Donata Clay  . S/P CABG x 3 2000    LIMA-LAD, SVG-RPDA, LRad-OM (known to be occluded)  . H/O unstable angina February 2004    Diagnostic cath showed severe circumflex disease with occluded radial graft to OM  . CAD (coronary artery disease) of artery bypass graft  February 2004     Occluded radial-OM graft; DES PCI proximal and mid Circumflex (mid: 2.5 mm 18 mm Cypher DES, proximal 2.5 mm x 13 mm Cypher DES)  . S/P cardiac catheterization  May 2010    Patent pedicled LIMA-LAD, patent SVG-PDA, patent circumflex stents. Also patent left carotid-left subclavian bypass;; apical and distal inferior posterior apical hypokinesis  . H/O echocardiogram July 2014    EF 50-55%; mild LVH. Grade 1 diastolic dysfunction. Moderate HJ of inferolateral wall. Mild AI. Mild MR.   Marland Kitchen History of nuclear stress test  September 2012    LexiScan Myoview: Large size, moderate-severe intensity defect in the basal inferoseptal, basal inferior walls as well as basal inferolateral mid inferolateral/apical lateral walls with mild reversibility. Thought to be mild hypokinesis in this distribution but inconsistent with size of defect. Would suggest viable myocardium  . Carotid artery disease -left Most recent Dopplers July 2014    History of left carotid angioplasty (Dr. Edilia Bo); carotid Dopplers July 2014: Bilateral ICAs less than 49% reduction. Patent left common carotid-subclavian artery bypass  . Subclavian artery stenosis, left Most recent Dopplers 2014     100% occlusion; status post left carotid-subclavian bypass (no evidence of steal phenomenon by cath in 2010, with adequate LIMA flow)  . Right renal artery stenosis 2004; most recent Doppler July 2014    PCI: Genesis 6.0 mm x 12 mm; abdominal vessel Dopplers July 2014: Right proximal renal  artery at stent equal to or less than 60%; left proximal renal artery less than 50%. Right and left kidneys are equal size and symmetric. Left lower kidney pole with a 0.7 cm x 2.9 centimeter cyst  . Occlusive mesenteric ischemia  2007; Doppler July 2014    BMS - SMA; Dr. Fredia Sorrow (VIR); followup Dopplers July 2014: SMA and celiac arteries flow velocities consistent with greater than 60% diameter reduction  . PAD (peripheral artery disease) Most  recent Doppler July 2014     extensive bilateral disease; R>L; Multiple bypass operations: AortoBifem (1998), R Fem-Pop; R Fem-tib; Fem-Fem -- occluded; 03/17/2013  Dopplerrs: R. ABI 0; left ABI 0.06. Right CFA, SFA-, femoropopliteal, fem-tib occluded; R. PT, AT-DP occluded.  LEFT distal EIA high resistance, CFA<50%,  peroneal not visualized -  progression of L. ABI (followup 6 months)   . Hypertension      partially renovascular  . Hyperlipidemia LDL goal <70   . Gastroparesis   . Peptic ulcer disease   . Osteoarthritis of back      and Knees  . Chronic back pain   . Aortic valve stenosis, moderate 2014    Calcific stenosis with increased velocities in the mid to distal aorta consistent with the greater than 60% diameter reduction.  . Small bowel obstruction due to adhesions October 2004    Status post LOA  . Gastritis     Past Cardiovascular Evaluation/Procedures   Procedure Laterality Date  . Coronary artery bypass graft  2000    LIMA-LAD, SVG-RPDA, left radial-OM  . Carotid endarterectomy Left     Dr. Arbie Cookey  . Pr vein bypass graft,aorto-fem-pop  1998    right leg  . Carotid artery - subclavian artery bypass graft Left     Dr. Edilia Bo  . Renal artery stent Right 2004    Dr. Alanda Amass 6 mm x 12 mm Genesis  . Superior mestenteric artery stent  2007    Dr. Fredia Sorrow  . Coronary stent placement  2004    (for occluded radial-OM graft) Proximal circumflex 2.5 mm x 13 mm Cypher DES; mid circumflex 2.5 mm x 18 mm Cypher DES  . Cardiac catheterization  2010    Patent LIMA-LAD with good distal flow to diagonals; patent SVG-RPDA, patent circumflex stents. Patent left carotid-subclavian bypass with patent LIMA-LAD  . Laparoscopic lysis of adhesions  October 2004  . Aortobifem  1998    Dr. Micah Noel  . Femoral-tibial bypass graft Left    Medications and Allergies Reviewed in Epic.  History   Social History Narrative   Divorced white woman. Mother of 2. Accompanied by her daughter  who usually accompanies her. As live with her now for just over 2 years.   She quit smoking in 1987 after greater than 20 years of one pack per day.   She denies any alcohol use.   She is in Lipitor he around the house, but does not walk much due to a combination of significant claudication mostly in her right foot but now also on her left. She also has significant back related pain. This limits her activities.   ROS: A comprehensive Review of Systems - Negative except Pertinent positives noted above most of the claudication symptoms. She also has significant severe back pain with 2 disc space disease. There is no plans for any operations of this. She also notes mild lower STEMI edema which is relatively chronic for her and not troublesome. She has intentionally lost about 6 pounds since her  last visit.  PHYSICAL EXAM BP 150/62  Pulse 71  Ht 5\' 5"  (1.651 m)  Wt 154 lb 12.8 oz (70.217 kg)  BMI 25.76 kg/m2 General appearance: alert and oriented x3, cooperative, appears older than stated age, no distress; chronically ill appearing. Normal mood and affect HEENT: [place]/AT, EOMI, MMM, anicteric sclera Neck: no adenopathy, no JVD, supple, symmetrical, trachea midline, thyroid not enlarged, symmetric;; Bilateral bruits also heard in the subclavian region. But brisk carotid upstroke. Lungs: CTAB, normal percussion bilaterally and nonlabored, good air movement Heart: prominent apical impulse and regular rate and rhythm; normal S1 and S2 with an S4 gallop. Soft 2/6 SEM. Otherwise no HSM. Abdomen: soft, non-tender; bowel sounds normal; no masses,  no organomegaly Extremities: no ulcers, gangrene or trophic changes; mild rubor noted but nothing significant. 2 right foot is a little bit cooler to touch than the left. No mottled appearance Pulses: Left Popliteal present 1+ Right Popliteal absent Left Dorsalis Pedis present 1+ Right Dorsalis Pedis barely palpable Left Posterior Tibial present 1+ Right  Posterior Tibial absent Neurologic: Grossly normal  [id number] today: Yes Rate: 66 , Rhythm: NSR;  mostly normal, with inferior Q waves and nonspecific T wave changes.  Recent Labs: No recent labs in Epic. Lipids are followed by PCP.  ASSESSMENT / PLAN: She's very complex patient with a detailed long history of Above. She remains as active as she can be, and really does not have that much the way of any cardiovascular complaints besides the claudication. We had a long talk about her desires for evaluation, she is not necessarily interested in repeating Dopplers for her abdominal aorta and renal arteries as well as mesenteric artery. Since she doesn't have claudication symptoms she is reluctant to have followup with her Dopplers. Talked briefly about having a followup nuclear stress test at a time next visit. She would prefer to wait and talk about it see her back in about 6-12 months. She said sees her PCP quite frequently he was monitoring her lipids and blood pressure. If she has additional symptoms she will certainly come back sooner.  CAD, multiple vessel -- status post CABG x3 (LIMA-LAD, SVG-PDA, SVG-OM*that is occluded) - stents in native No active cardiac symptoms. She is somewhat limited in sports activity level. Therefore she may not be quite pushing herself enough to have symptoms. She is on statin aspirin nitrates ARB beta blocker and tolerating them well.  Plan will be to discuss possibility of followup stress test next year.  Based on her lack of desire to have vascular ultrasound done, it not sure that she would be agreeable to having stress test done next year.  Subclavian artery stenosis, left She will be due to have followup carotids. They have been stable of late. I think maybe checking them every other year is reasonable. She just simply asked why she needs to have annual checks.  Right renal artery stenosis Table blood pressure. In myelination would be that if there was  some signs of stenosis, her blood pressure would probably be higher.  Peripheral vascular occlusive disease  Again followup Dopplers her patient request we'll not be done this year unless they ordered without my knowing.  Hyperlipidemia LDL goal <70 On 3 medications -- statin, fenofibrate and omega-3 fatty acids. Monitored by PCP to  Hypertension Relatively well-controlled. A little high today, my examination would be to monitor this to the blood pressure and only adjusted to a significant increase or decrease his.   Orders Placed This Encounter  Procedures  . EKG 12-Lead   Meds ordered this encounter  Medications  . NITROSTAT 0.4 MG SL tablet    Sig:   . Cholecalciferol (VITAMIN D3) 1000 UNITS CAPS    Sig: Take by mouth daily.    Followup: 12 months  Samiyyah Moffa W. Herbie BaltimoreHARDING, M.D., M.S. [facility] [address] [place], [place]  [id number]  [phone] Pager # [phone]

## 2014-01-18 NOTE — Assessment & Plan Note (Signed)
On 3 medications -- statin, fenofibrate and omega-3 fatty acids. Monitored by PCP to

## 2014-01-30 IMAGING — CT CT ABD-PELV W/O CM
2 of 4 series · 17 of 46 positions shown, 19 images · IV contrast (READICAT)
Comparison: 12/28/2008

CLINICAL DATA: Right lower quadrant abdominal pain.  Status post
stent placement in proximal superior mesenteric artery on
01/16/2006 to treat arterial stenosis and also prior right renal
artery stent placement.  The patient is allergic to IV contrast.

CT ABDOMEN AND PELVIS WITHOUT CONTRAST
TECHNIQUE: Multidetector CT imaging of the abdomen and pelvis was
performed following the standard protocol without intravenous
contrast.

[Series 2: abd/pelvis without · axial · non-contrast · 0.74mm/px · z∈[-422,+4]mm · 14 of 93 slices shown, 16 images]
[im 4/93  soft-tissue]
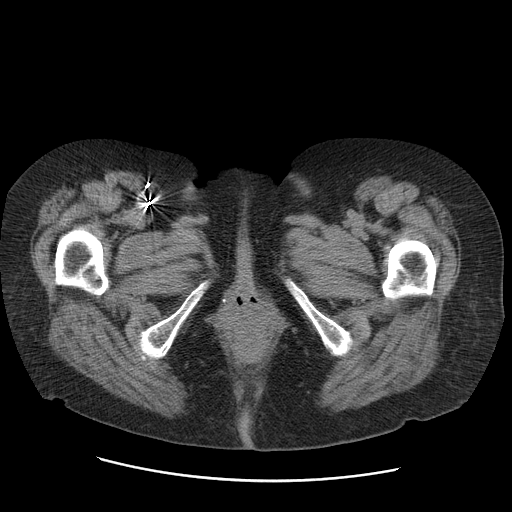
[im 4/93  bone]
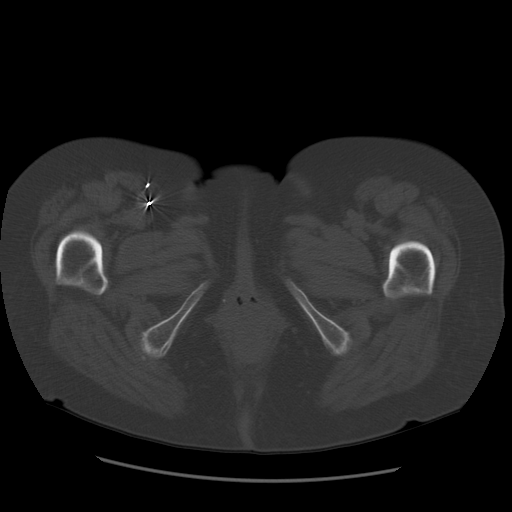
[im 12/93  soft-tissue]
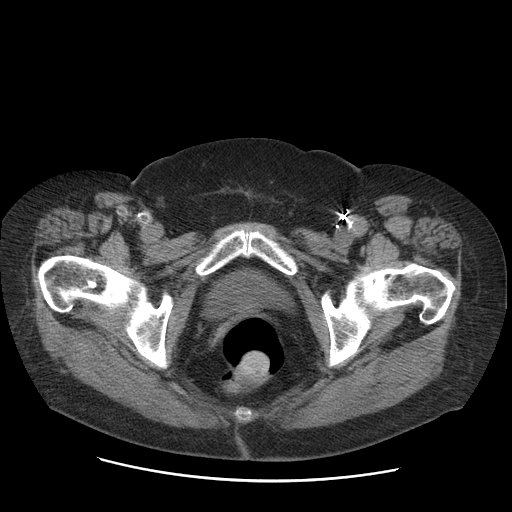
[im 20/93  soft-tissue]
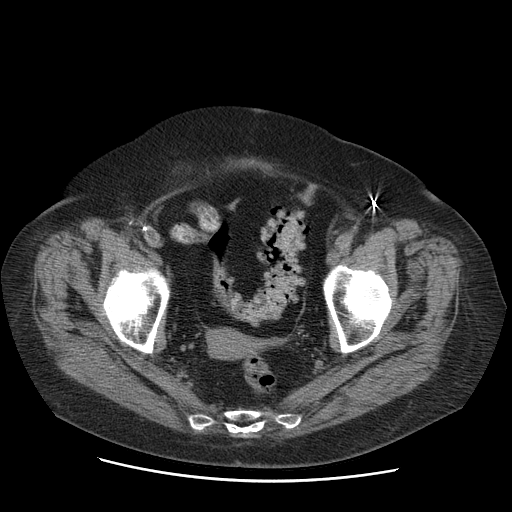
[im 24/93  soft-tissue]
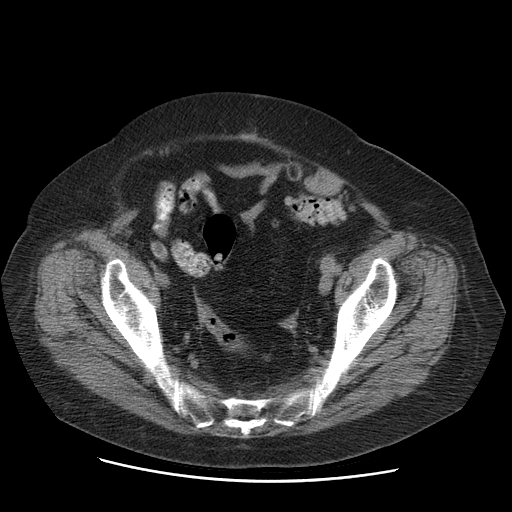
[im 31/93  soft-tissue]
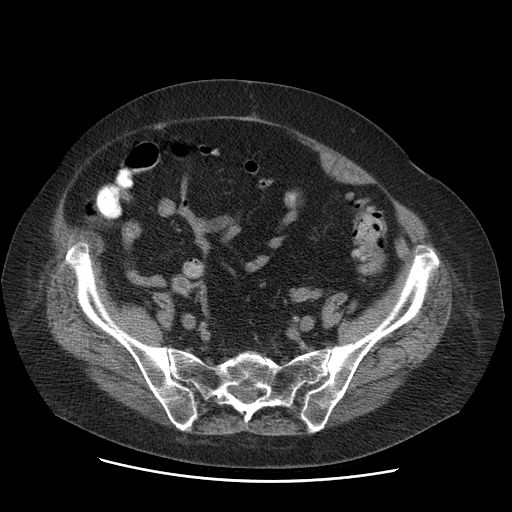
[im 39/93  soft-tissue]
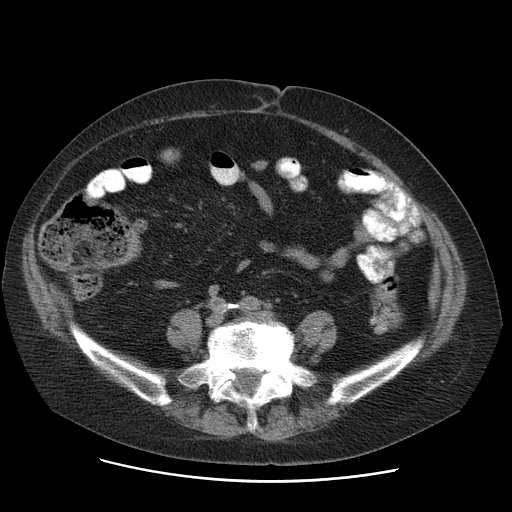
[im 43/93  soft-tissue]
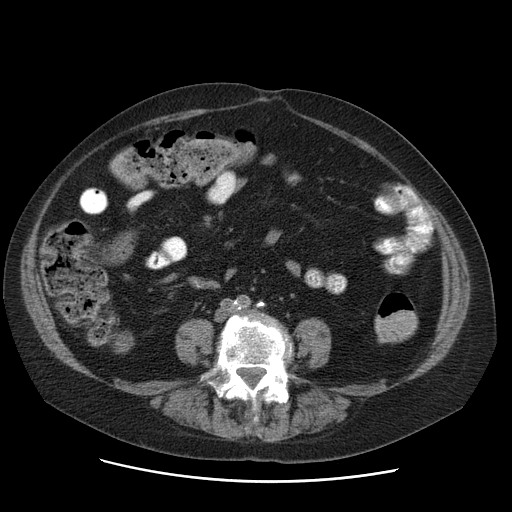
[im 50/93  soft-tissue]
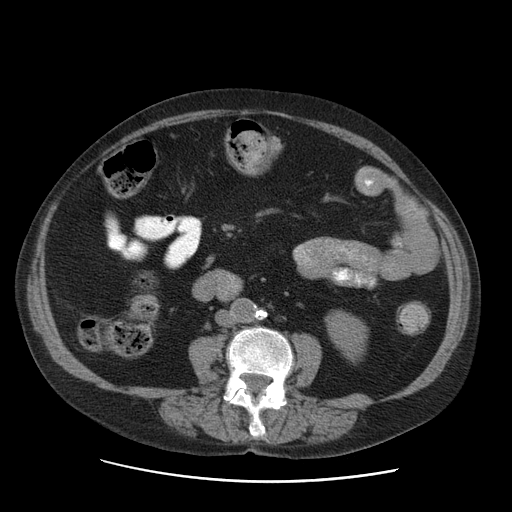
[im 54/93  soft-tissue]
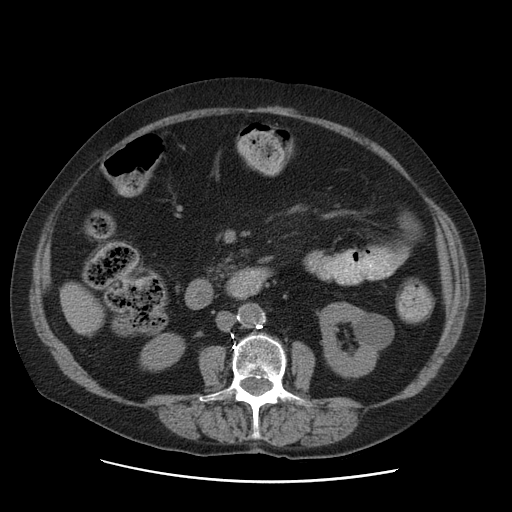
[im 54/93  bone]
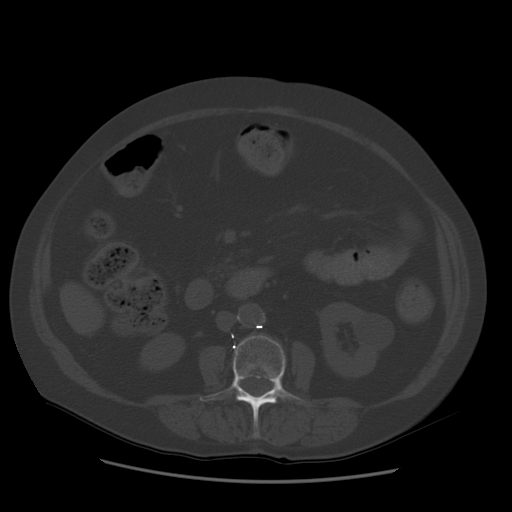
[im 62/93  soft-tissue]
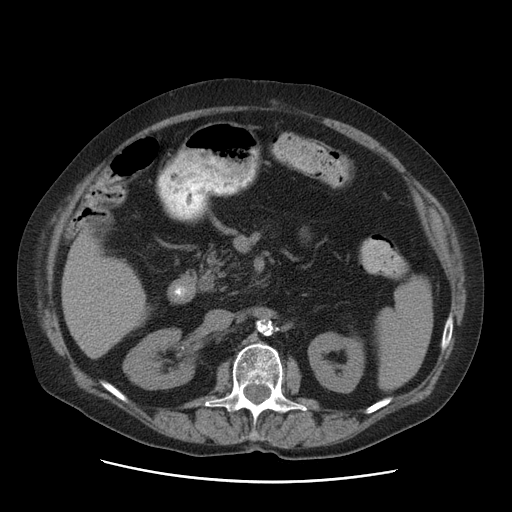
[im 70/93  soft-tissue]
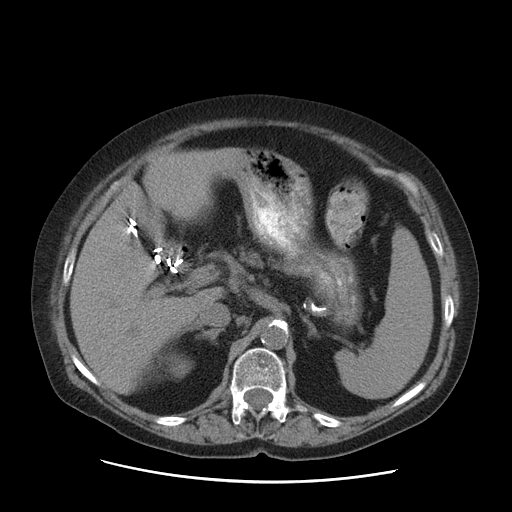
[im 73/93  soft-tissue]
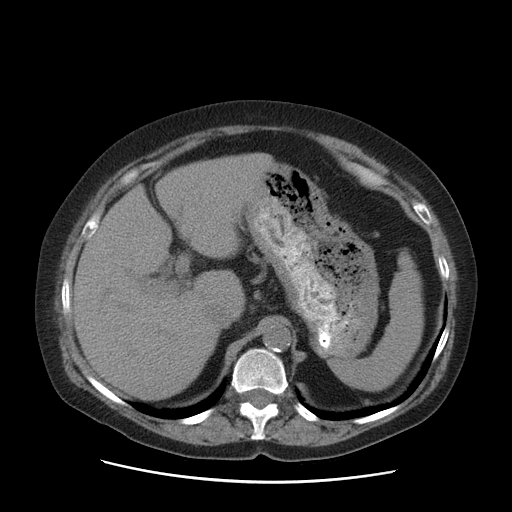
[im 81/93  soft-tissue]
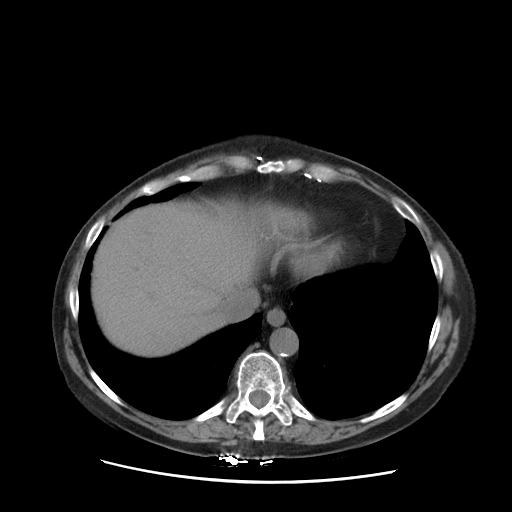
[im 89/93  soft-tissue]
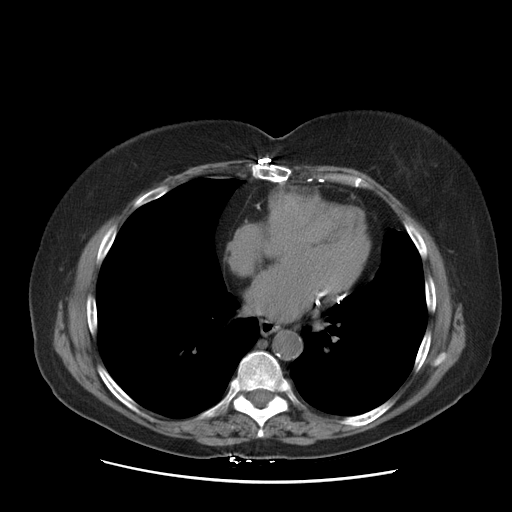

[Series 400: coronal · coronal · 0.94mm/px · 3 of 127 slices shown]
[im 43/127  soft-tissue]
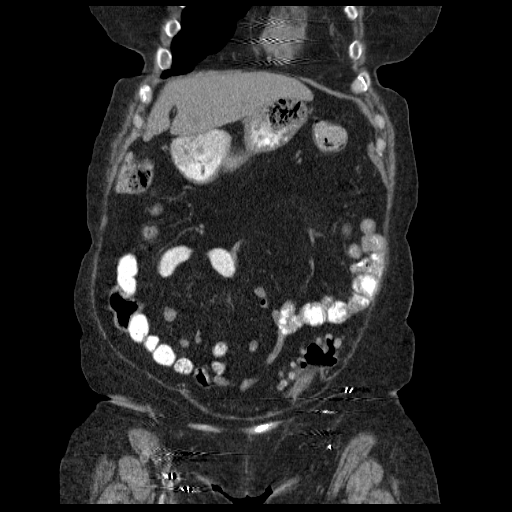
[im 57/127  soft-tissue]
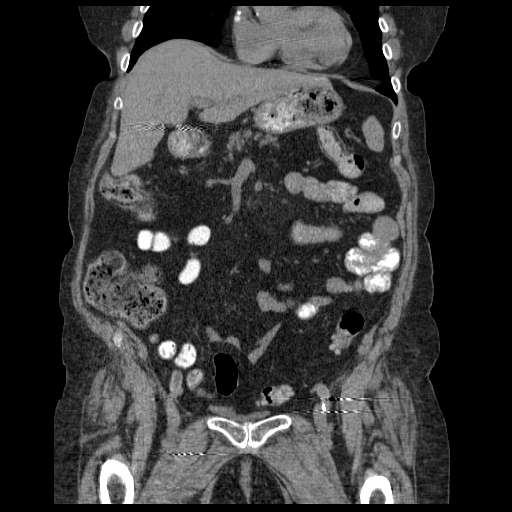
[im 71/127  soft-tissue]
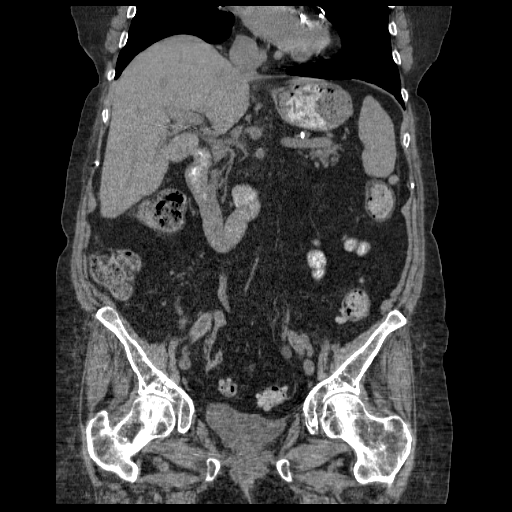

[17 of 46 positions shown; findings below may reference images not displayed]

FINDINGS: There is evidence of progressive atherosclerotic disease
and calcified plaque buildup within the mid native aorta just
superior to the an aortobifemoral graft. Based on plaque
morphology, there likely is a significant underlying focal aortic
stenosis.  Correlation suggested with any symptoms of lower
extremity claudication.

Positioning of the proximal superior mesenteric artery and right
renal artery stents is stable by CT.  No acute inflammatory process
is identified in the abdomen or pelvis.  There is stable
diverticulosis of the sigmoid colon without evidence of acute
diverticulitis.  No abnormal fluid collections.  No hernias.  No
masses or enlarged lymph nodes are identified.  Unenhanced
appearance of the solid organs including the liver, spleen,
pancreas, adrenal glands and kidneys are stable.  The gallbladder
has been removed.  No evidence of bowel obstruction or ileus.
Stable spondylosis of the lower lumbar spine.
IMPRESSION: 1.  Progressive atherosclerotic disease of the native mid abdominal
aorta just superior to the proximal anastomosis of an
aortobifemoral graft.  This may be causing significant aortic
stenosis and correlation is suggested with any symptoms of lower
extremity claudication.
2.  No acute inflammatory process identified in the abdomen or
pelvis.

## 2014-03-03 ENCOUNTER — Telehealth (HOSPITAL_COMMUNITY): Payer: Self-pay | Admitting: *Deleted

## 2015-01-12 ENCOUNTER — Ambulatory Visit: Payer: Medicaid Other | Admitting: Cardiology

## 2015-03-04 ENCOUNTER — Other Ambulatory Visit: Payer: Self-pay | Admitting: Internal Medicine

## 2015-03-05 ENCOUNTER — Other Ambulatory Visit: Payer: Self-pay | Admitting: Internal Medicine

## 2015-03-06 ENCOUNTER — Other Ambulatory Visit: Payer: Self-pay | Admitting: Internal Medicine

## 2015-03-06 DIAGNOSIS — Z1231 Encounter for screening mammogram for malignant neoplasm of breast: Secondary | ICD-10-CM

## 2015-03-11 ENCOUNTER — Ambulatory Visit: Payer: Medicaid Other | Admitting: Cardiology

## 2015-03-20 ENCOUNTER — Ambulatory Visit: Payer: Medicaid Other

## 2015-03-23 ENCOUNTER — Ambulatory Visit: Payer: Medicaid Other

## 2015-05-08 ENCOUNTER — Ambulatory Visit: Payer: Medicaid Other | Admitting: Cardiology

## 2015-05-15 ENCOUNTER — Ambulatory Visit: Payer: Medicaid Other | Admitting: Cardiology

## 2015-05-15 ENCOUNTER — Encounter: Payer: Self-pay | Admitting: Cardiology

## 2015-05-27 ENCOUNTER — Telehealth: Payer: Self-pay | Admitting: *Deleted

## 2015-05-27 NOTE — Telephone Encounter (Signed)
Spoke to patient'S daughter Silvio Pate. Result given . Verbalized understanding

## 2015-05-27 NOTE — Telephone Encounter (Signed)
-----   Message from Marykay Lex, MD sent at 05/25/2015  6:34 PM EDT ----- Recent Labs: 05/07/15  Na+ 143, K+ 5.1, Cl- 107, HCO3- 28 , BUN 20, Cr 1.3, Glu 83, Ca2+ 9.7; AST 27, ALT 15 AlkP 40, Alb 3.8, TP 6.6, T Bili 0.4 CBC: W 4.5, H/H 13.4/39.9, Plt 245 TC 131, TG 143, HDL 32, LDL 70  Labs look pretty good - Lipids are pretty much at goal except for HDL -- need exercise to help that.  No change to Rx.  Marykay Lex, MD

## 2015-06-01 ENCOUNTER — Encounter: Payer: Self-pay | Admitting: Cardiology

## 2015-06-01 ENCOUNTER — Ambulatory Visit (INDEPENDENT_AMBULATORY_CARE_PROVIDER_SITE_OTHER): Payer: Medicare Other | Admitting: Cardiology

## 2015-06-01 VITALS — BP 132/60 | HR 72 | Ht 65.0 in | Wt 156.3 lb

## 2015-06-01 DIAGNOSIS — I2510 Atherosclerotic heart disease of native coronary artery without angina pectoris: Secondary | ICD-10-CM | POA: Diagnosis not present

## 2015-06-01 DIAGNOSIS — E785 Hyperlipidemia, unspecified: Secondary | ICD-10-CM

## 2015-06-01 DIAGNOSIS — I739 Peripheral vascular disease, unspecified: Secondary | ICD-10-CM | POA: Diagnosis not present

## 2015-06-01 DIAGNOSIS — I779 Disorder of arteries and arterioles, unspecified: Secondary | ICD-10-CM | POA: Diagnosis not present

## 2015-06-01 DIAGNOSIS — Z951 Presence of aortocoronary bypass graft: Secondary | ICD-10-CM

## 2015-06-01 DIAGNOSIS — I10 Essential (primary) hypertension: Secondary | ICD-10-CM

## 2015-06-01 DIAGNOSIS — I70219 Atherosclerosis of native arteries of extremities with intermittent claudication, unspecified extremity: Secondary | ICD-10-CM

## 2015-06-01 DIAGNOSIS — Z23 Encounter for immunization: Secondary | ICD-10-CM | POA: Diagnosis not present

## 2015-06-01 DIAGNOSIS — Z8679 Personal history of other diseases of the circulatory system: Secondary | ICD-10-CM

## 2015-06-01 NOTE — Patient Instructions (Signed)
NO CHANGE IN CURRENT MEDICATIONS   Your physician wants you to follow-up in 18 MONTH WITH DR HARDING. You will receive a reminder letter in the mail two months in advance. If you don't receive a letter, please call our office to schedule the follow-up appointment.

## 2015-06-01 NOTE — Progress Notes (Signed)
PCP: Martha Clan, MD  Clinic Note: Chief Complaint  Patient presents with  . Follow-up    1 YR F/U.Marland KitchenMarland KitchenNO CHEST PAIN/PRESSURE OR SOB  . Coronary Artery Disease  . PAD    HPI: Betty Floyd is a 81 y.o. female with a PMH below who presents today for ~ delayed annual f/u for CAD. 2nd f/u with me.  Former patient of Dr. Alanda Amass in December of last year. She's been Dr. Alanda Amass patient for well over 30 years he followup for her CAD dating back to an MI in 1976 treated medically as well as PAD followed by Dr. Alanda Amass but covered by Dr. Edilia Bo and Dr. Darrick Penna from vascular surgery. Her last cardiac episode was back in 2004 -- she had native circumflex stents placed for progression of native circumflex disease in the setting of an occluded radial graft to an obtuse marginal.    Betty Floyd was last seen in May 2015  Recent Hospitalizations: None recorded.  Studies Reviewed: None  Has been troubled with Gastroparesis.  Interval History: Betty Floyd presents today really without any major cardiac complaints. She was troubled by GI issues all summer long and delay her visits here. She canceled several visits. She's had multiple episodes of nausea and vomiting abdominal pain & also had UTI. The cardiac standpoint, she has not had any major complaints. Her cardiac review of systems is negative as follows:  No chest pain or shortness of breath with rest or exertion.  No PND, orthopnea or edema. No palpitations, syncope/near syncope. No TIA/amaurosis fugax symptoms. No melena, hematochezia, hematuria, or epstaxis. Activity restricted by Chronic Back pain as well as claudication R>L Leg.  Can't walk too far - so in wheelchair here. @ home uses walker.    She is indicated, and her daughter agrees, that she really has difficulty making it to doctor's appointments and would like to minimize any time she has come to the doctor. She has been relatively stable cardiac standpoint she has  chronic claudication which is essentially not treatable. Has not had any issues with acute ischemia. They have asked to take an minimize clinic visits.  ROS: A comprehensive was performed. Review of Systems  Constitutional: Positive for malaise/fatigue.  HENT: Negative for nosebleeds.   Cardiovascular: Positive for claudication.  Gastrointestinal: Positive for nausea, abdominal pain, diarrhea and constipation. Negative for blood in stool and melena.  Genitourinary: Positive for hematuria (Only with UTI).  Musculoskeletal: Positive for back pain and joint pain. Negative for falls.  Neurological: Positive for dizziness and weakness (Generalized).  Endo/Heme/Allergies: Bruises/bleeds easily.  All other systems reviewed and are negative.    Past Medical History  Diagnosis Date  . Posterior MI (HCC) 1976    While on OCPs; age 6  . CAD, multiple vessel Progression until 2000    Referred for CABG x3 by Dr. Donata Clay  . S/P CABG x 3 2000    LIMA-LAD, SVG-RPDA, LRad-OM (known to be occluded)  . H/O unstable angina February 2004    Diagnostic cath showed severe circumflex disease with occluded radial graft to OM  . CAD (coronary artery disease) of artery bypass graft February 2004     Occluded radial-OM graft; DES PCI proximal and mid Circumflex (mid: 2.5 mm 18 mm Cypher DES, proximal 2.5 mm x 13 mm Cypher DES)  . S/P cardiac catheterization  May 2010    Patent pedicled LIMA-LAD, patent SVG-PDA, patent circumflex stents. Also patent left carotid-left subclavian bypass;; apical and distal inferior posterior  apical hypokinesis  . H/O echocardiogram July 2014    EF 50-55%; mild LVH. Grade 1 diastolic dysfunction. Moderate HJ of inferolateral wall. Mild AI. Mild MR.   Marland Kitchen History of nuclear stress test  September 2012    LexiScan Myoview: Large size, moderate-severe intensity defect in the basal inferoseptal, basal inferior walls as well as basal inferolateral mid inferolateral/apical lateral walls  with mild reversibility. Thought to be mild hypokinesis in this distribution but inconsistent with size of defect. Would suggest viable myocardium  . Carotid artery disease -left Most recent Dopplers July 2014    History of left carotid angioplasty (Dr. Edilia Bo); carotid Dopplers July 2014: Bilateral ICAs less than 49% reduction. Patent left common carotid-subclavian artery bypass  . Subclavian artery stenosis, left Most recent Dopplers 2014     100% occlusion; status post left carotid-subclavian bypass (no evidence of steal phenomenon by cath in 2010, with adequate LIMA flow)  . Right renal artery stenosis (HCC) 2004; most recent Doppler July 2014    PCI: Genesis 6.0 mm x 12 mm; abdominal vessel Dopplers July 2014: Right proximal renal artery at stent equal to or less than 60%; left proximal renal artery less than 50%. Right and left kidneys are equal size and symmetric. Left lower kidney pole with a 0.7 cm x 2.9 centimeter cyst  . Occlusive mesenteric ischemia (HCC)  2007; Doppler July 2014    BMS - SMA; Dr. Fredia Sorrow (VIR); followup Dopplers July 2014: SMA and celiac arteries flow velocities consistent with greater than 60% diameter reduction  . PAD (peripheral artery disease) (HCC) Most recent Doppler July 2014     extensive bilateral disease; R>L; Multiple bypass operations: AortoBifem (1998), R Fem-Pop; R Fem-tib; Fem-Fem -- occluded; 03/17/2013  Dopplerrs: R. ABI 0; left ABI 0.06. Right CFA, SFA-, femoropopliteal, fem-tib occluded; R. PT, AT-DP occluded.  LEFT distal EIA high resistance, CFA<50%,  peroneal not visualized -  progression of L. ABI (followup 6 months)   . Hypertension      partially renovascular  . Hyperlipidemia LDL goal <70   . Gastroparesis   . Peptic ulcer disease   . Osteoarthritis of back      and Knees  . Chronic back pain   . Aortic valve stenosis, moderate 2014    Calcific stenosis with increased velocities in the mid to distal aorta consistent with the greater than  60% diameter reduction.  . Small bowel obstruction due to adhesions [facility]) October 2004    Status post LOA  . Gastritis     Past Surgical History  Procedure Laterality Date  . Coronary artery bypass graft  2000    LIMA-LAD, SVG-RPDA, left radial-OM  . Colon surgery  2004    r/t blockage  . Carotid endarterectomy Left     Dr. Arbie Cookey  . Pr vein bypass graft,aorto-fem-pop  1998    right leg  . Carotid artery - subclavian artery bypass graft Left     Dr. Edilia Bo  . Renal artery stent Right 2004    Dr. Alanda Amass 6 mm x 12 mm Genesis  . Superior mestenteric artery stent  2007    Dr. Fredia Sorrow  . Coronary stent placement  2004    (for occluded radial-OM graft) Proximal circumflex 2.5 mm x 13 mm Cypher DES; mid circumflex 2.5 mm x 18 mm Cypher DES  . Cardiac catheterization  2010    Patent LIMA-LAD with good distal flow to diagonals; patent SVG-RPDA, patent circumflex stents. Patent left carotid-subclavian bypass with patent LIMA-LAD  .  Laparoscopic lysis of adhesions  October 2004  . Aortobifem  1998    Dr. Micah Noel  . Femoral-tibial bypass graft Left    Prior to Admission medications   Medication Sig Start Date End Date Taking? Authorizing Provider  acetaminophen (TYLENOL) 500 MG tablet Take 1,000 mg by mouth every 8 (eight) hours.    Yes Historical Provider, MD  ALPRAZolam Prudy Feeler) 0.25 MG tablet Take 0.5 mg by mouth at bedtime as needed for sleep.   Yes Historical Provider, MD  aspirin 81 MG tablet Take 81 mg by mouth every morning.    Yes Historical Provider, MD  atorvastatin (LIPITOR) 20 MG tablet Take 20 mg by mouth every morning.    Yes Historical Provider, MD  Cholecalciferol (VITAMIN D3) 1000 UNITS CAPS Take by mouth daily.   Yes Historical Provider, MD  cilostazol (PLETAL) 100 MG tablet Take 100 mg by mouth 2 (two) times daily.     Yes Historical Provider, MD  clopidogrel (PLAVIX) 75 MG tablet Take 75 mg by mouth daily.    Yes Historical Provider, MD  DULoxetine (CYMBALTA) 60  MG capsule Take 60 mg by mouth every evening.    Yes Historical Provider, MD  fenofibrate 160 MG tablet Take 160 mg by mouth every morning.    Yes Historical Provider, MD  isosorbide mononitrate (IMDUR) 30 MG 24 hr tablet Take 30 mg by mouth daily at 6 PM.    Yes Historical Provider, MD  lidocaine (LIDODERM) 5 % Place 3 patches onto the skin every evening. Remove & Discard patch within 12 hours or as directed by MD.   Yes Historical Provider, MD  losartan (COZAAR) 50 MG tablet Take 50 mg by mouth every morning.    Yes Historical Provider, MD  metoprolol tartrate (LOPRESSOR) 25 MG tablet Take 25 mg by mouth 2 (two) times daily.     Yes Historical Provider, MD  NITROSTAT 0.4 MG SL tablet Place 0.4 mg under the tongue every 5 (five) minutes as needed for chest pain.  11/08/13  Yes Historical Provider, MD  Omega-3 Fatty Acids (FISH OIL) 1000 MG CAPS Take 1 capsule by mouth every morning.    Yes Historical Provider, MD  OVER THE COUNTER MEDICATION Take 25 mg by mouth at bedtime. MED NAME: Benadryl   Yes Historical Provider, MD  oxyCODONE (OXY IR/ROXICODONE) 5 MG immediate release tablet Take 5 mg by mouth every 6 (six) hours as needed for pain. 03/14/13  Yes Sena Hitch, MD  pantoprazole (PROTONIX) 40 MG tablet Take 40 mg by mouth 2 (two) times daily.     Yes Historical Provider, MD  polyethylene glycol (MIRALAX / GLYCOLAX) packet Take 17-34 g by mouth every morning.    Yes Historical Provider, MD  pregabalin (LYRICA) 100 MG capsule Take 100 mg by mouth 3 (three) times daily.     Yes Historical Provider, MD  sucralfate (CARAFATE) 1 G tablet Take 1 g by mouth 2 (two) times daily.    Yes Historical Provider, MD  temazepam (RESTORIL) 15 MG capsule Take 15 mg by mouth at bedtime as needed for sleep.    Yes Historical Provider, MD  traMADol (ULTRAM) 50 MG tablet Take 100 mg by mouth every 6 (six) hours.    Yes Historical Provider, MD   Allergies  Allergen Reactions  . Oxycodone Other (See Comments)     Causes Hallucinations makes her go crazy.  Patient seems to think that she can take med very short term.   Francene [employer] [  Iodinated Diagnostic Agents] Hives, Itching and Rash    Benadryl works to counteract reaction  . Iohexol Hives, Itching and Rash    01-11-06---pt had erythema & mild itching with 13 hr prep, Onset Date: 16109604   . Promethazine Hives, Itching and Rash  . Vicodin [Hydrocodone-Acetaminophen] Hives, Itching and Rash     Social History   Social History  . Marital Status: Divorced    Spouse Name: N/A  . Number of Children: N/A  . Years of Education: N/A   Social History Main Topics  . Smoking status: Former Smoker -- 1.00 packs/day for 20 years    Types: Cigarettes    Quit date: 05/03/1986  . Smokeless tobacco: Never Used  . Alcohol Use: No  . Drug Use: No  . Sexual Activity: Not Asked   Other Topics Concern  . None   Social History Narrative   Divorced white woman. Mother of 2. Accompanied by her daughter who usually accompanies her. As live with her now for just over 2 years.   She quit smoking in 1987 after greater than 20 years of one pack per day.   She denies any alcohol use.   She is in Lipitor he around the house, but does not walk much due to a combination of significant claudication mostly in her right foot but now also on her left. She also has significant back related pain. This limits her activities.    Family History  Problem Relation Age of Onset  . Diabetes Mother   . Heart disease Father   . Heart disease Sister   . Heart disease Brother   . Heart attack Brother      Wt Readings from Last 3 Encounters:  06/01/15 156 lb 4.8 oz (70.897 kg)  01/16/14 154 lb 12.8 oz (70.217 kg)  08/06/13 151 lb 14.4 oz (68.901 kg)    PHYSICAL EXAM BP 132/60 mmHg  Pulse 72  Ht  (1.651 m)  Wt 156 lb 4.8 oz (70.897 kg)  BMI 26.01 kg/m2 General appearance: alert, cooperative, appears stated age.  She wears a scarf on her head. Does appear to  be chronically ill but not in any acute distress Neck: no adenopathy, no carotid bruit and no JVD Lungs: clear to auscultation bilaterally, normal percussion bilaterally and non-labored Heart: regular rate and rhythm, S1 & S2 normal, + S4 gallop; soft 2/6 SEM at RUSB. Abdomen: soft, non-tender; bowel sounds normal; no masses,  no organomegaly; no HJR Extremities: extremities normal, atraumatic, no cyanosis, and edema trace Pulses: 2+ and symmetric;  Neurologic: Mental status: Alert, oriented, thought content appropriate Cranial nerves: normal (II-XII grossly intact)    Adult ECG Report  Rate: 72 ;  Rhythm: normal sinus rhythm and Non-specific ST-T abnormality - consider lateral ischemia; normal Axis, intervals, & durations.;   Narrative Interpretation: Stable EKG - no change from prior EKG   Other studies Reviewed: Additional studies/ records that were reviewed today include:  Recent Labs:  None available -- apparently recently checked by PCP.    ASSESSMENT / PLAN: Problem List Items Addressed This Visit    S/P CABG x 3 (Chronic)    Defer further evaluation in the absence of symptoms      Peripheral vascular occlusive disease (HCC) (Chronic)   Relevant Orders   EKG 12-Lead (Completed)   Need for prophylactic vaccination and inoculation against influenza    She certainly has enough risk factors and warrants flu vaccine. Will provider for her today.  Relevant Orders   EKG 12-Lead (Completed)   Flu Vaccine QUAD 36+ mos IM (Completed)   Hyperlipidemia LDL goal <70 (Chronic)    On statin, fenofibrate and omega-3 fatty acids. Monitored by PCP.      Relevant Orders   EKG 12-Lead (Completed)   H/O unstable angina (Chronic)    No active anginal symptoms      Essential hypertension (Chronic)    Well-controlled on current medications. Likely has a renovascular component but tolerating ARB.      Relevant Orders   EKG 12-Lead (Completed)   Carotid artery disease -left    Relevant Orders   EKG 12-Lead (Completed)   CAD, multiple vessel -- status post CABG x3 (LIMA-LAD, SVG-PDA, SVG-OM*that is occluded) - stents in native - Primary (Chronic)    No active cardiac symptoms. On aspirin plus Plavix for common ratio CAD and PAD. On ARB, Imdur and low-dose beta blocker.  In the absence of symptoms, she would prefer to not have any noninvasive evaluations. Follow clinically.      Relevant Orders   EKG 12-Lead (Completed)   Atherosclerosis of native artery of extremity with intermittent claudication (HCC) (Chronic)    She continues to have claudication with significant lower extremity arterial disease. On Pletal plus aspirin and Plavix. In the past he really wasn't much the way of any significant options available. Followed by Dr. Edilia Bo. Continue to walk as tolerated.         Current medicines are reviewed at length with the patient today. (+/- concerns) would like to keep things simple -- She would however like to take the flu shot today. The following changes have been made: No change to medications. -- flu shot given today   Studies Ordered:   Orders Placed This Encounter  Procedures  . Flu Vaccine QUAD 36+ mos IM  . EKG 12-Lead    ROV 18 months.  Marykay Lex, M.D., M.S. Interventional Cardiologist   Pager # [phone]

## 2015-06-08 ENCOUNTER — Encounter: Payer: Self-pay | Admitting: Cardiology

## 2015-06-08 DIAGNOSIS — Z23 Encounter for immunization: Secondary | ICD-10-CM | POA: Insufficient documentation

## 2015-06-08 NOTE — Assessment & Plan Note (Signed)
She certainly has enough risk factors and warrants flu vaccine. Will provider for her today.

## 2015-06-08 NOTE — Assessment & Plan Note (Signed)
Defer further evaluation in the absence of symptoms

## 2015-06-08 NOTE — Assessment & Plan Note (Signed)
She continues to have claudication with significant lower extremity arterial disease. On Pletal plus aspirin and Plavix. In the past he really wasn't much the way of any significant options available. Followed by Dr. Edilia Bo. Continue to walk as tolerated.

## 2015-06-08 NOTE — Assessment & Plan Note (Signed)
No active anginal symptoms

## 2015-06-08 NOTE — Assessment & Plan Note (Signed)
Well-controlled on current medications. Likely has a renovascular component but tolerating ARB.

## 2015-06-08 NOTE — Assessment & Plan Note (Signed)
On statin, fenofibrate and omega-3 fatty acids. Monitored by PCP.

## 2015-06-08 NOTE — Assessment & Plan Note (Signed)
No active cardiac symptoms. On aspirin plus Plavix for common ratio CAD and PAD. On ARB, Imdur and low-dose beta blocker.  In the absence of symptoms, she would prefer to not have any noninvasive evaluations. Follow clinically.

## 2016-10-19 ENCOUNTER — Encounter (HOSPITAL_COMMUNITY): Payer: Self-pay | Admitting: Emergency Medicine

## 2016-10-19 ENCOUNTER — Inpatient Hospital Stay (HOSPITAL_COMMUNITY)
Admission: EM | Admit: 2016-10-19 | Discharge: 2016-11-27 | DRG: 085 | Disposition: E | Payer: Medicare Other | Attending: Internal Medicine | Admitting: Internal Medicine

## 2016-10-19 DIAGNOSIS — Z79899 Other long term (current) drug therapy: Secondary | ICD-10-CM

## 2016-10-19 DIAGNOSIS — S066X0A Traumatic subarachnoid hemorrhage without loss of consciousness, initial encounter: Secondary | ICD-10-CM | POA: Diagnosis not present

## 2016-10-19 DIAGNOSIS — R402252 Coma scale, best verbal response, oriented, at arrival to emergency department: Secondary | ICD-10-CM | POA: Diagnosis present

## 2016-10-19 DIAGNOSIS — Z23 Encounter for immunization: Secondary | ICD-10-CM

## 2016-10-19 DIAGNOSIS — G8191 Hemiplegia, unspecified affecting right dominant side: Secondary | ICD-10-CM | POA: Diagnosis present

## 2016-10-19 DIAGNOSIS — R51 Headache: Secondary | ICD-10-CM | POA: Diagnosis not present

## 2016-10-19 DIAGNOSIS — Z7189 Other specified counseling: Secondary | ICD-10-CM

## 2016-10-19 DIAGNOSIS — L8990 Pressure ulcer of unspecified site, unspecified stage: Secondary | ICD-10-CM | POA: Insufficient documentation

## 2016-10-19 DIAGNOSIS — I609 Nontraumatic subarachnoid hemorrhage, unspecified: Secondary | ICD-10-CM

## 2016-10-19 DIAGNOSIS — M549 Dorsalgia, unspecified: Secondary | ICD-10-CM | POA: Diagnosis present

## 2016-10-19 DIAGNOSIS — I252 Old myocardial infarction: Secondary | ICD-10-CM

## 2016-10-19 DIAGNOSIS — E785 Hyperlipidemia, unspecified: Secondary | ICD-10-CM | POA: Diagnosis present

## 2016-10-19 DIAGNOSIS — N179 Acute kidney failure, unspecified: Secondary | ICD-10-CM | POA: Diagnosis present

## 2016-10-19 DIAGNOSIS — Z66 Do not resuscitate: Secondary | ICD-10-CM | POA: Diagnosis present

## 2016-10-19 DIAGNOSIS — S0101XA Laceration without foreign body of scalp, initial encounter: Secondary | ICD-10-CM | POA: Diagnosis present

## 2016-10-19 DIAGNOSIS — I10 Essential (primary) hypertension: Secondary | ICD-10-CM | POA: Diagnosis present

## 2016-10-19 DIAGNOSIS — Z951 Presence of aortocoronary bypass graft: Secondary | ICD-10-CM

## 2016-10-19 DIAGNOSIS — K3184 Gastroparesis: Secondary | ICD-10-CM | POA: Diagnosis present

## 2016-10-19 DIAGNOSIS — Z955 Presence of coronary angioplasty implant and graft: Secondary | ICD-10-CM

## 2016-10-19 DIAGNOSIS — R402142 Coma scale, eyes open, spontaneous, at arrival to emergency department: Secondary | ICD-10-CM | POA: Diagnosis present

## 2016-10-19 DIAGNOSIS — Z4659 Encounter for fitting and adjustment of other gastrointestinal appliance and device: Secondary | ICD-10-CM

## 2016-10-19 DIAGNOSIS — I25119 Atherosclerotic heart disease of native coronary artery with unspecified angina pectoris: Secondary | ICD-10-CM | POA: Diagnosis present

## 2016-10-19 DIAGNOSIS — G8929 Other chronic pain: Secondary | ICD-10-CM | POA: Diagnosis present

## 2016-10-19 DIAGNOSIS — R1310 Dysphagia, unspecified: Secondary | ICD-10-CM | POA: Diagnosis present

## 2016-10-19 DIAGNOSIS — G9340 Encephalopathy, unspecified: Secondary | ICD-10-CM | POA: Diagnosis present

## 2016-10-19 DIAGNOSIS — W01198A Fall on same level from slipping, tripping and stumbling with subsequent striking against other object, initial encounter: Secondary | ICD-10-CM | POA: Diagnosis present

## 2016-10-19 DIAGNOSIS — I70219 Atherosclerosis of native arteries of extremities with intermittent claudication, unspecified extremity: Secondary | ICD-10-CM | POA: Diagnosis present

## 2016-10-19 DIAGNOSIS — R0603 Acute respiratory distress: Secondary | ICD-10-CM

## 2016-10-19 DIAGNOSIS — Z7982 Long term (current) use of aspirin: Secondary | ICD-10-CM

## 2016-10-19 DIAGNOSIS — E876 Hypokalemia: Secondary | ICD-10-CM | POA: Diagnosis not present

## 2016-10-19 DIAGNOSIS — S39012A Strain of muscle, fascia and tendon of lower back, initial encounter: Secondary | ICD-10-CM

## 2016-10-19 DIAGNOSIS — I350 Nonrheumatic aortic (valve) stenosis: Secondary | ICD-10-CM | POA: Diagnosis present

## 2016-10-19 DIAGNOSIS — Z515 Encounter for palliative care: Secondary | ICD-10-CM | POA: Diagnosis not present

## 2016-10-19 DIAGNOSIS — R402362 Coma scale, best motor response, obeys commands, at arrival to emergency department: Secondary | ICD-10-CM | POA: Diagnosis present

## 2016-10-19 DIAGNOSIS — Z7902 Long term (current) use of antithrombotics/antiplatelets: Secondary | ICD-10-CM

## 2016-10-19 DIAGNOSIS — R4701 Aphasia: Secondary | ICD-10-CM | POA: Diagnosis present

## 2016-10-19 DIAGNOSIS — Z87891 Personal history of nicotine dependence: Secondary | ICD-10-CM

## 2016-10-19 DIAGNOSIS — L8991 Pressure ulcer of unspecified site, stage 1: Secondary | ICD-10-CM | POA: Diagnosis present

## 2016-10-19 DIAGNOSIS — K117 Disturbances of salivary secretion: Secondary | ICD-10-CM

## 2016-10-19 DIAGNOSIS — I2510 Atherosclerotic heart disease of native coronary artery without angina pectoris: Secondary | ICD-10-CM | POA: Diagnosis present

## 2016-10-19 DIAGNOSIS — R739 Hyperglycemia, unspecified: Secondary | ICD-10-CM | POA: Diagnosis present

## 2016-10-19 MED ORDER — ONDANSETRON HCL 4 MG/2ML IJ SOLN
4.0000 mg | Freq: Once | INTRAMUSCULAR | Status: AC
  Administered 2016-10-20: 4 mg via INTRAVENOUS
  Filled 2016-10-19: qty 2

## 2016-10-19 MED ORDER — MORPHINE SULFATE (PF) 4 MG/ML IV SOLN
2.0000 mg | Freq: Once | INTRAVENOUS | Status: AC
  Administered 2016-10-20: 2 mg via INTRAVENOUS
  Filled 2016-10-19: qty 1

## 2016-10-19 NOTE — ED Triage Notes (Signed)
Per EMS pt from home, pt was trying to get back in bed slipped fell and hit head on floor, laceration to back of head, lower back pain chronic, pt is on blood thinner. Pt has DNR not present, denies LOC

## 2016-10-19 NOTE — ED Provider Notes (Addendum)
[facility]-EMERGENCY DEPT Provider Note   CSN: [id number] Arrival date & time: 2016-10-25  2312 By signing my name below, I, Levon Hedger, attest that this documentation has been prepared under the direction and in the presence of Gilda Crease, MD . Electronically Signed: Levon Hedger, Scribe. October 25, 2016. 11:37 PM.   History   Chief Complaint Chief Complaint  Patient presents with  . Fall   HPI Betty Floyd is a 83 y.o. female with a PMHx of CAD, MI, HTN and chronic back pain who presents to the Emergency Department complaining of for sudden onset, constant, 8/10 pain to posterior head s/p fall. Pt states was trying to get back in bed when she slipped backwards and fell, striking her posterior head on the floor. Actively bleeding laceration noted to pt's occipital scalp. She reports associated sudden onset lower back pain. She states "I felt another disk slip". No prior treatments indicated. No alleviating or modifying factors noted. She denies any LOC, neck pain, or any other associated symptoms. Tetanus status unknown.  PCP: Martha Clan, MD   The history is provided by the patient. No language interpreter was used.    Past Medical History:  Diagnosis Date  . Aortic valve stenosis, moderate 2014   Calcific stenosis with increased velocities in the mid to distal aorta consistent with the greater than 60% diameter reduction.  Marland Kitchen CAD (coronary artery disease) of artery bypass graft February 2004    Occluded radial-OM graft; DES PCI proximal and mid Circumflex (mid: 2.5 mm 18 mm Cypher DES, proximal 2.5 mm x 13 mm Cypher DES)  . CAD, multiple vessel Progression until 2000   Referred for CABG x3 by Dr. Donata Clay  . Carotid artery disease -left Most recent Dopplers July 2014   History of left carotid angioplasty (Dr. Edilia Bo); carotid Dopplers July 2014: Bilateral ICAs less than 49% reduction. Patent left common carotid-subclavian artery bypass  . Chronic back pain   . Gastritis    . Gastroparesis   . H/O echocardiogram July 2014   EF 50-55%; mild LVH. Grade 1 diastolic dysfunction. Moderate HJ of inferolateral wall. Mild AI. Mild MR.   . H/O unstable angina February 2004   Diagnostic cath showed severe circumflex disease with occluded radial graft to OM  . History of nuclear stress test  September 2012   LexiScan Myoview: Large size, moderate-severe intensity defect in the basal inferoseptal, basal inferior walls as well as basal inferolateral mid inferolateral/apical lateral walls with mild reversibility. Thought to be mild hypokinesis in this distribution but inconsistent with size of defect. Would suggest viable myocardium  . Hyperlipidemia LDL goal <70   . Hypertension     partially renovascular  . Occlusive mesenteric ischemia (HCC)  2007; Doppler July 2014   BMS - SMA; Dr. Fredia Sorrow ([facility]IR); followup Dopplers July 2014: SMA and celiac arteries flow velocities consistent with greater than 60% diameter reduction  . Osteoarthritis of back     and Knees  . PAD (peripheral artery disease) (HCC) Most recent Doppler July 2014    extensive bilateral disease; R>L; Multiple bypass operations: AortoBifem (1998), R Fem-Pop; R Fem-tib; Fem-Fem -- occluded; 03/17/2013  Dopplerrs: R. ABI 0; left ABI 0.06. Right CFA, SFA-, femoropopliteal, fem-tib occluded; R. PT, AT-DP occluded.  LEFT distal EIA high resistance, CFA<50%,  peroneal not visualized -  progression of L. ABI (followup 6 months)   . Peptic ulcer disease   . Posterior MI (HCC) 1976   While on OCPs; age 62  . Right  renal artery stenosis (HCC) 2004; most recent Doppler July 2014   PCI: Genesis 6.0 mm x 12 mm; abdominal vessel Dopplers July 2014: Right proximal renal artery at stent equal to or less than 60%; left proximal renal artery less than 50%. Right and left kidneys are equal size and symmetric. Left lower kidney pole with a 0.7 cm x 2.9 centimeter cyst  . S/P CABG x 3 2000   LIMA-LAD, SVG-RPDA, LRad-OM (known to be  occluded)  . S/P cardiac catheterization  May 2010   Patent pedicled LIMA-LAD, patent SVG-PDA, patent circumflex stents. Also patent left carotid-left subclavian bypass;; apical and distal inferior posterior apical hypokinesis  . Small bowel obstruction due to adhesions October 2004   Status post LOA  . Subclavian artery stenosis, left (HCC) Most recent Dopplers 2014    100% occlusion; status post left carotid-subclavian bypass (no evidence of steal phenomenon by cath in 2010, with adequate LIMA flow)    Patient Active Problem List   Diagnosis Date Noted  . Need for prophylactic vaccination and inoculation against influenza 06/08/2015  . CAD, multiple vessel -- status post CABG x3 (LIMA-LAD, SVG-PDA, SVG-OM*that is occluded) - stents in native   . S/P CABG x 3   . Carotid artery disease -left   . Subclavian artery stenosis, left (HCC)   . Essential hypertension   . Hyperlipidemia LDL goal <70   . Right renal artery stenosis (HCC)   . Back pain 03/14/2013  . Peripheral vascular occlusive disease (HCC) 04/17/2012  . Unspecified circulatory system disorder 05/24/2011  . Atherosclerosis of native artery of extremity with intermittent claudication (HCC) 05/24/2011  . H/O unstable angina 09/29/2002    Past Surgical History:  Procedure Laterality Date  . Aortobifem  1998   Dr. Micah Noel  . CARDIAC CATHETERIZATION  2010   Patent LIMA-LAD with good distal flow to diagonals; patent SVG-RPDA, patent circumflex stents. Patent left carotid-subclavian bypass with patent LIMA-LAD  . CAROTID ARTERY - SUBCLAVIAN ARTERY BYPASS GRAFT Left    Dr. Edilia Bo  . CAROTID ENDARTERECTOMY Left    Dr. Arbie Cookey  . COLON SURGERY  2004   r/t blockage  . CORONARY ARTERY BYPASS GRAFT  2000   LIMA-LAD, SVG-RPDA, left radial-OM  . CORONARY STENT PLACEMENT  2004   (for occluded radial-OM graft) Proximal circumflex 2.5 mm x 13 mm Cypher DES; mid circumflex 2.5 mm x 18 mm Cypher DES  . FEMORAL-TIBIAL BYPASS GRAFT  Left   . LAPAROSCOPIC LYSIS OF ADHESIONS  October 2004  . PR VEIN BYPASS GRAFT,AORTO-FEM-POP  1998   right leg  . RENAL ARTERY STENT Right 2004   Dr. Alanda Amass 6 mm x 12 mm Genesis  . SUPERIOR MESTENTERIC ARTERY STENT  2007   Dr. Fredia Sorrow    OB History    No data available       Home Medications    Prior to Admission medications   Medication Sig Start Date End Date Taking? Authorizing Provider  acetaminophen (TYLENOL) 500 MG tablet Take 1,000 mg by mouth every 8 (eight) hours.     Historical Provider, MD  ALPRAZolam Prudy Feeler) 0.25 MG tablet Take 0.5 mg by mouth at bedtime as needed for sleep.    Historical Provider, MD  aspirin 81 MG tablet Take 81 mg by mouth every morning.     Historical Provider, MD  atorvastatin (LIPITOR) 20 MG tablet Take 20 mg by mouth every morning.     Historical Provider, MD  Cholecalciferol (VITAMIN D3) 1000 UNITS CAPS Take  by mouth daily.    Historical Provider, MD  cilostazol (PLETAL) 100 MG tablet Take 100 mg by mouth 2 (two) times daily.      Historical Provider, MD  clopidogrel (PLAVIX) 75 MG tablet Take 75 mg by mouth daily.     Historical Provider, MD  DULoxetine (CYMBALTA) 60 MG capsule Take 60 mg by mouth every evening.     Historical Provider, MD  fenofibrate 160 MG tablet Take 160 mg by mouth every morning.     Historical Provider, MD  isosorbide mononitrate (IMDUR) 30 MG 24 hr tablet Take 30 mg by mouth daily at 6 PM.     Historical Provider, MD  lidocaine (LIDODERM) 5 % Place 3 patches onto the skin every evening. Remove & Discard patch within 12 hours or as directed by MD.    Historical Provider, MD  losartan (COZAAR) 50 MG tablet Take 50 mg by mouth every morning.     Historical Provider, MD  metoprolol tartrate (LOPRESSOR) 25 MG tablet Take 25 mg by mouth 2 (two) times daily.      Historical Provider, MD  NITROSTAT 0.4 MG SL tablet Place 0.4 mg under the tongue every 5 (five) minutes as needed for chest pain.  11/08/13   Historical Provider, MD   Omega-3 Fatty Acids (FISH OIL) 1000 MG CAPS Take 1 capsule by mouth every morning.     Historical Provider, MD  OVER THE COUNTER MEDICATION Take 25 mg by mouth at bedtime. MED NAME: Benadryl    Historical Provider, MD  oxyCODONE (OXY IR/ROXICODONE) 5 MG immediate release tablet Take 5 mg by mouth every 6 (six) hours as needed for pain. 03/14/13   Sena Hitch, MD  pantoprazole (PROTONIX) 40 MG tablet Take 40 mg by mouth 2 (two) times daily.      Historical Provider, MD  polyethylene glycol (MIRALAX / GLYCOLAX) packet Take 17-34 g by mouth every morning.     Historical Provider, MD  pregabalin (LYRICA) 100 MG capsule Take 100 mg by mouth 3 (three) times daily.      Historical Provider, MD  sucralfate (CARAFATE) 1 G tablet Take 1 g by mouth 2 (two) times daily.     Historical Provider, MD  temazepam (RESTORIL) 15 MG capsule Take 15 mg by mouth at bedtime as needed for sleep.     Historical Provider, MD  traMADol (ULTRAM) 50 MG tablet Take 100 mg by mouth every 6 (six) hours.     Historical Provider, MD    Family History Family History  Problem Relation Age of Onset  . Diabetes Mother   . Heart disease Father   . Heart disease Sister   . Heart disease Brother   . Heart attack Brother     Social History Social History  Substance Use Topics  . Smoking status: Former Smoker    Packs/day: 1.00    Years: 20.00    Types: Cigarettes    Quit date: 05/03/1986  . Smokeless tobacco: Never Used  . Alcohol use No     Allergies   Oxycodone; Contrast media [iodinated diagnostic agents]; Iohexol; Promethazine; and Vicodin [hydrocodone-acetaminophen]  Review of Systems Review of Systems 10 systems reviewed and all are negative for acute change except as noted in the HPI.   Physical Exam Updated Vital Signs SpO2 97%   Physical Exam  Constitutional: She is oriented to person, place, and time. She appears well-developed and well-nourished. No distress.  HENT:  Head: Normocephalic.  Right  Ear: Hearing normal.  Left Ear: Hearing normal.  Nose: Nose normal.  Mouth/Throat: Oropharynx is clear and moist and mucous membranes are normal.  Occipital scalp laceration 4cm  Eyes: Conjunctivae and EOM are normal. Pupils are equal, round, and reactive to light.  Neck: Normal range of motion. Neck supple.  Cardiovascular: S1 normal and S2 normal.  An irregularly irregular rhythm present. Tachycardia present.  Exam reveals no gallop and no friction rub.   No murmur heard. Pulmonary/Chest: Effort normal and breath sounds normal. No respiratory distress. She exhibits no tenderness.  Abdominal: Soft. Normal appearance and bowel sounds are normal. There is no hepatosplenomegaly. There is no tenderness. There is no rebound, no guarding, no tenderness at McBurney's point and negative Murphy's sign. No hernia.  Musculoskeletal: Normal range of motion.  Diffuse lumbar tenderness  Neurological: She is alert and oriented to person, place, and time. She has normal strength. No cranial nerve deficit or sensory deficit. Coordination normal. GCS eye subscore is 4. GCS verbal subscore is 5. GCS motor subscore is 6.  Skin: Skin is warm, dry and intact. No rash noted. No cyanosis.  Psychiatric: She has a normal mood and affect. Her speech is normal and behavior is normal. Thought content normal.  Nursing note and vitals reviewed.    ED Treatments / Results  DIAGNOSTIC STUDIES:  Oxygen Saturation is 97% on RA, normal by my interpretation.    COORDINATION OF CARE:  11:34 PM Discussed treatment plan with pt at bedside and pt agreed to plan.   Labs (all labs ordered are listed, but only abnormal results are displayed) Labs Reviewed  CBC WITH DIFFERENTIAL/PLATELET  BASIC METABOLIC PANEL  PROTIME-INR    EKG  EKG Interpretation None       Radiology Dg Lumbar Spine Complete  Result Date: 10/20/2016 CLINICAL DATA:  Fall with low back pain EXAM: LUMBAR SPINE - COMPLETE 4+ VIEW COMPARISON:  CT  04/10/2012 FINDINGS: Surgical clips in the right upper quadrant. SI joints are symmetric. Surgical clips in the retroperitoneum and groins. Vascular stents overlie L1 and L2. Lumbar alignment within normal limits. Vertebral body are grossly maintained. Moderate severe narrowing at L4-L5 and L5-S1 with endplate changes and osteophyte. IMPRESSION: 1. Degenerative changes.  No definite acute osseous abnormality. 2. Atherosclerotic vascular calcifications. Electronically Signed   By: Jasmine PangKim  Fujinaga M.D.   On: 10/20/2016 00:20   Ct Head Wo Contrast  Result Date: 10/20/2016 CLINICAL DATA:  Status post fall. Hit back of head on floor. Patient on blood thinners. Initial encounter. EXAM: CT HEAD WITHOUT CONTRAST TECHNIQUE: Contiguous axial images were obtained from the base of the skull through the vertex without intravenous contrast. COMPARISON:  CT of the head performed 08/14/2009, and MRI of the brain from 08/16/2009 FINDINGS: Brain: Focal acute subarachnoid hemorrhage is noted along the medial left frontal lobe, with trace blood tracking along the left anterior falx cerebri. No evidence of acute infarction, hydrocephalus, extra-axial collection or mass lesion. Prominence of the ventricles and sulci reflects mild cortical volume loss. Mild cerebellar atrophy is noted. Scattered periventricular and subcortical white matter change likely reflects small vessel ischemic microangiopathy. The brainstem and fourth ventricle are within normal limits. The basal ganglia are unremarkable in appearance. No midline shift is seen. Vascular: No hyperdense vessel or unexpected calcification. Skull: There is no evidence of fracture; visualized osseous structures are unremarkable in appearance. Sinuses/Orbits: The orbits are within normal limits. The paranasal sinuses and mastoid air cells are well-aerated. Other: Soft tissue swelling is noted at the posterior vertex. IMPRESSION:  1. Focal acute subarachnoid hemorrhage along the medial  left frontal lobe, with trace blood tracking along the left anterior falx cerebri. 2. Soft tissue swelling at the posterior vertex. 3. Mild cortical volume loss and scattered small vessel ischemic microangiopathy. Critical Value/emergent results were called by telephone at the time of interpretation on 10/20/2016 at 12:34 am to Dr. Jaci Carrel, who verbally acknowledged these results. Electronically Signed   By: Roanna Raider M.D.   On: 10/20/2016 00:35    Procedures Procedures (including critical care time)  Medications Ordered in ED Medications  Tdap (BOOSTRIX) injection 0.5 mL (not administered)  morphine 4 MG/ML injection 2 mg (2 mg Intravenous Given 10/20/16 0054)  ondansetron (ZOFRAN) injection 4 mg (4 mg Intravenous Given 10/20/16 0052)  lidocaine-EPINEPHrine (XYLOCAINE W/EPI) 2 %-1:200000 (PF) injection 10 mL (10 mLs Infiltration Given 10/20/16 0055)     Initial Impression / Assessment and Plan / ED Course  I have reviewed the triage vital signs and the nursing notes.  Pertinent labs & imaging results that were available during my care of the patient were reviewed by me and considered in my medical decision making (see chart for details).     Patient presents to the emergency department after a ground-level fall. Patient is on Plavix and Pletal. She is awake, alert and oriented. CT scan performed, however, does show evidence of frontal subarachnoid hemorrhage. CT scan of cervical spine does not show acute injury. CT findings discussed with Dr. Franky Macho, agrees with hospitalist admission and neuro checks. Can recheck CT in the morning and patient will be seen by neurosurgery in consultation tomorrow.  Patient also complaining of lower back pain. She has a history of chronic low back pain secondary to "2 slipped disks". Patient did have diffuse tenderness but x-ray was negative. Patient has normal strength, sensation on examination. No evidence of neurologic deficit including no  saddle anesthesia or foot drop. Patient administered analgesia.  Addendum: Almost immediately after discussion with hospitalist service about admission, patient's mental status changed. I did reevaluate the patient. She was still awake and following commands with extremities but unable to speak. This did not appear to be seizure-like activity, as she was alert and able to squeeze fingers and follow some commands during this activity. Patient was therefore sent back to radiology for repeat CT and there was significant worsening of the bleeding.  Discussed with Dr. Franky Macho again. He is coming to see the patient in the ER. Recommends admission to the ICU. Discussed with Dr. Sung Amabile, will evaluate for admission to the ICU.  CRITICAL CARE Performed by: Gilda Crease   Total critical care time: 40 minutes  Critical care time was exclusive of separately billable procedures and treating other patients.  Critical care was necessary to treat or prevent imminent or life-threatening deterioration.  Critical care was time spent personally by me on the following activities: development of treatment plan with patient and/or surrogate as well as nursing, discussions with consultants, evaluation of patient's response to treatment, examination of patient, obtaining history from patient or surrogate, ordering and performing treatments and interventions, ordering and review of laboratory studies, ordering and review of radiographic studies, pulse oximetry and re-evaluation of patient's condition.   Final Clinical Impressions(s) / ED Diagnoses   Final diagnoses:  Subarachnoid hemorrhage following injury, no loss of consciousness, initial encounter (HCC)  Laceration of scalp, initial encounter  Lumbar strain, initial encounter    New Prescriptions New Prescriptions   No medications on file  I personally performed  the services described in this documentation, which was scribed in my presence. The  recorded information has been reviewed and is accurate.    Gilda Crease, MD 10/20/16 0104    Gilda Crease, MD 10/20/16 [phone]

## 2016-10-20 ENCOUNTER — Inpatient Hospital Stay (HOSPITAL_COMMUNITY): Payer: Medicare Other

## 2016-10-20 ENCOUNTER — Emergency Department (HOSPITAL_COMMUNITY): Payer: Medicare Other

## 2016-10-20 ENCOUNTER — Observation Stay (HOSPITAL_COMMUNITY): Payer: Medicare Other

## 2016-10-20 ENCOUNTER — Other Ambulatory Visit (HOSPITAL_COMMUNITY): Payer: Self-pay | Admitting: Radiology

## 2016-10-20 DIAGNOSIS — Z23 Encounter for immunization: Secondary | ICD-10-CM | POA: Diagnosis present

## 2016-10-20 DIAGNOSIS — I609 Nontraumatic subarachnoid hemorrhage, unspecified: Secondary | ICD-10-CM

## 2016-10-20 DIAGNOSIS — S066X0D Traumatic subarachnoid hemorrhage without loss of consciousness, subsequent encounter: Secondary | ICD-10-CM | POA: Diagnosis not present

## 2016-10-20 DIAGNOSIS — R0600 Dyspnea, unspecified: Secondary | ICD-10-CM | POA: Diagnosis not present

## 2016-10-20 DIAGNOSIS — Z955 Presence of coronary angioplasty implant and graft: Secondary | ICD-10-CM | POA: Diagnosis not present

## 2016-10-20 DIAGNOSIS — Z79899 Other long term (current) drug therapy: Secondary | ICD-10-CM | POA: Diagnosis not present

## 2016-10-20 DIAGNOSIS — K117 Disturbances of salivary secretion: Secondary | ICD-10-CM | POA: Diagnosis not present

## 2016-10-20 DIAGNOSIS — G8191 Hemiplegia, unspecified affecting right dominant side: Secondary | ICD-10-CM | POA: Diagnosis not present

## 2016-10-20 DIAGNOSIS — I2510 Atherosclerotic heart disease of native coronary artery without angina pectoris: Secondary | ICD-10-CM | POA: Diagnosis not present

## 2016-10-20 DIAGNOSIS — I252 Old myocardial infarction: Secondary | ICD-10-CM | POA: Diagnosis not present

## 2016-10-20 DIAGNOSIS — W01198A Fall on same level from slipping, tripping and stumbling with subsequent striking against other object, initial encounter: Secondary | ICD-10-CM | POA: Diagnosis not present

## 2016-10-20 DIAGNOSIS — S0101XA Laceration without foreign body of scalp, initial encounter: Secondary | ICD-10-CM | POA: Diagnosis not present

## 2016-10-20 DIAGNOSIS — Z515 Encounter for palliative care: Secondary | ICD-10-CM | POA: Diagnosis present

## 2016-10-20 DIAGNOSIS — I25119 Atherosclerotic heart disease of native coronary artery with unspecified angina pectoris: Secondary | ICD-10-CM | POA: Diagnosis not present

## 2016-10-20 DIAGNOSIS — E785 Hyperlipidemia, unspecified: Secondary | ICD-10-CM | POA: Diagnosis not present

## 2016-10-20 DIAGNOSIS — G9340 Encephalopathy, unspecified: Secondary | ICD-10-CM | POA: Diagnosis not present

## 2016-10-20 DIAGNOSIS — N390 Urinary tract infection, site not specified: Secondary | ICD-10-CM | POA: Diagnosis not present

## 2016-10-20 DIAGNOSIS — Z66 Do not resuscitate: Secondary | ICD-10-CM | POA: Diagnosis present

## 2016-10-20 DIAGNOSIS — R4701 Aphasia: Secondary | ICD-10-CM | POA: Diagnosis not present

## 2016-10-20 DIAGNOSIS — Z87891 Personal history of nicotine dependence: Secondary | ICD-10-CM | POA: Diagnosis not present

## 2016-10-20 DIAGNOSIS — R402252 Coma scale, best verbal response, oriented, at arrival to emergency department: Secondary | ICD-10-CM | POA: Diagnosis present

## 2016-10-20 DIAGNOSIS — R1310 Dysphagia, unspecified: Secondary | ICD-10-CM | POA: Diagnosis not present

## 2016-10-20 DIAGNOSIS — R402142 Coma scale, eyes open, spontaneous, at arrival to emergency department: Secondary | ICD-10-CM | POA: Diagnosis not present

## 2016-10-20 DIAGNOSIS — N179 Acute kidney failure, unspecified: Secondary | ICD-10-CM | POA: Diagnosis not present

## 2016-10-20 DIAGNOSIS — Z951 Presence of aortocoronary bypass graft: Secondary | ICD-10-CM | POA: Diagnosis not present

## 2016-10-20 DIAGNOSIS — S066X0A Traumatic subarachnoid hemorrhage without loss of consciousness, initial encounter: Secondary | ICD-10-CM | POA: Diagnosis present

## 2016-10-20 DIAGNOSIS — G8929 Other chronic pain: Secondary | ICD-10-CM | POA: Diagnosis not present

## 2016-10-20 DIAGNOSIS — I350 Nonrheumatic aortic (valve) stenosis: Secondary | ICD-10-CM | POA: Diagnosis not present

## 2016-10-20 DIAGNOSIS — R51 Headache: Secondary | ICD-10-CM | POA: Diagnosis present

## 2016-10-20 DIAGNOSIS — I10 Essential (primary) hypertension: Secondary | ICD-10-CM | POA: Diagnosis not present

## 2016-10-20 DIAGNOSIS — Z7902 Long term (current) use of antithrombotics/antiplatelets: Secondary | ICD-10-CM | POA: Diagnosis not present

## 2016-10-20 DIAGNOSIS — M549 Dorsalgia, unspecified: Secondary | ICD-10-CM | POA: Diagnosis not present

## 2016-10-20 DIAGNOSIS — Z7189 Other specified counseling: Secondary | ICD-10-CM | POA: Diagnosis not present

## 2016-10-20 DIAGNOSIS — Z7982 Long term (current) use of aspirin: Secondary | ICD-10-CM | POA: Diagnosis not present

## 2016-10-20 LAB — BASIC METABOLIC PANEL
Anion gap: 10 (ref 5–15)
BUN: 17 mg/dL (ref 6–20)
CALCIUM: 9.5 mg/dL (ref 8.9–10.3)
CHLORIDE: 102 mmol/L (ref 101–111)
CO2: 28 mmol/L (ref 22–32)
CREATININE: 1.09 mg/dL — AB (ref 0.44–1.00)
GFR calc Af Amer: 54 mL/min — ABNORMAL LOW (ref >60)
GFR calc non Af Amer: 46 mL/min — ABNORMAL LOW (ref >60)
GLUCOSE: 115 mg/dL — AB (ref 65–99)
Potassium: 4 mmol/L (ref 3.5–5.1)
Sodium: 140 mmol/L (ref 135–145)

## 2016-10-20 LAB — CBC WITH DIFFERENTIAL/PLATELET
Basophils Absolute: 0 10*3/uL (ref 0.0–0.1)
Basophils Relative: 0 %
Eosinophils Absolute: 0.1 10*3/uL (ref 0.0–0.7)
Eosinophils Relative: 3 %
HEMATOCRIT: 38.2 % (ref 36.0–46.0)
HEMOGLOBIN: 12.1 g/dL (ref 12.0–15.0)
Lymphocytes Relative: 16 %
Lymphs Abs: 0.7 10*3/uL (ref 0.7–4.0)
MCH: 29.2 pg (ref 26.0–34.0)
MCHC: 31.7 g/dL (ref 30.0–36.0)
MCV: 92.3 fL (ref 78.0–100.0)
MONO ABS: 0.6 10*3/uL (ref 0.1–1.0)
MONOS PCT: 13 %
NEUTROS ABS: 2.8 10*3/uL (ref 1.7–7.7)
NEUTROS PCT: 68 %
Platelets: 129 10*3/uL — ABNORMAL LOW (ref 150–400)
RBC: 4.14 MIL/uL (ref 3.87–5.11)
RDW: 13.4 % (ref 11.5–15.5)
WBC: 4.1 10*3/uL (ref 4.0–10.5)

## 2016-10-20 LAB — GLUCOSE, CAPILLARY
GLUCOSE-CAPILLARY: 95 mg/dL (ref 65–99)
Glucose-Capillary: 112 mg/dL — ABNORMAL HIGH (ref 65–99)

## 2016-10-20 LAB — PROTIME-INR
INR: 1.03
Prothrombin Time: 13.5 seconds (ref 11.4–15.2)

## 2016-10-20 LAB — MRSA PCR SCREENING: MRSA BY PCR: NEGATIVE

## 2016-10-20 MED ORDER — HYDRALAZINE HCL 20 MG/ML IJ SOLN: 10.0000 mg | INTRAMUSCULAR | Status: DC | PRN

## 2016-10-20 MED ORDER — ONDANSETRON HCL 4 MG/2ML IJ SOLN
INTRAMUSCULAR | Status: AC
  Filled 2016-10-20: qty 2

## 2016-10-20 MED ORDER — ONDANSETRON HCL 4 MG/2ML IJ SOLN
4.0000 mg | Freq: Four times a day (QID) | INTRAMUSCULAR | Status: DC | PRN
  Administered 2016-10-20 – 2016-10-21 (×3): 4 mg via INTRAVENOUS
  Filled 2016-10-20 (×3): qty 2

## 2016-10-20 MED ORDER — SODIUM CHLORIDE 0.9 % IV SOLN
10.0000 mL/h | Freq: Once | INTRAVENOUS | Status: AC
  Administered 2016-10-20: 10 mL/h via INTRAVENOUS

## 2016-10-20 MED ORDER — TETANUS-DIPHTH-ACELL PERTUSSIS 5-2.5-18.5 LF-MCG/0.5 IM SUSP
0.5000 mL | Freq: Once | INTRAMUSCULAR | Status: AC
  Administered 2016-10-20: 0.5 mL via INTRAMUSCULAR
  Filled 2016-10-20: qty 0.5

## 2016-10-20 MED ORDER — SODIUM CHLORIDE 0.9 % IV SOLN
500.0000 mg | Freq: Two times a day (BID) | INTRAVENOUS | Status: DC
  Administered 2016-10-20 – 2016-10-26 (×13): 500 mg via INTRAVENOUS
  Filled 2016-10-20 (×16): qty 5

## 2016-10-20 MED ORDER — ORAL CARE MOUTH RINSE
15.0000 mL | Freq: Two times a day (BID) | OROMUCOSAL | Status: DC
  Administered 2016-10-20 – 2016-10-26 (×9): 15 mL via OROMUCOSAL

## 2016-10-20 MED ORDER — SODIUM CHLORIDE 0.9 % IV SOLN
1000.0000 mg | Freq: Once | INTRAVENOUS | Status: AC
  Administered 2016-10-20: 1000 mg via INTRAVENOUS
  Filled 2016-10-20: qty 10

## 2016-10-20 MED ORDER — SODIUM CHLORIDE 0.9 % IV SOLN
INTRAVENOUS | Status: DC
  Administered 2016-10-20 – 2016-10-24 (×5): via INTRAVENOUS

## 2016-10-20 MED ORDER — MORPHINE SULFATE (PF) 2 MG/ML IV SOLN
2.0000 mg | Freq: Once | INTRAVENOUS | Status: AC
  Administered 2016-10-20: 2 mg via INTRAVENOUS
  Filled 2016-10-20: qty 1

## 2016-10-20 MED ORDER — LIDOCAINE-EPINEPHRINE (PF) 2 %-1:200000 IJ SOLN
10.0000 mL | Freq: Once | INTRAMUSCULAR | Status: AC
  Administered 2016-10-20: 10 mL
  Filled 2016-10-20: qty 20

## 2016-10-20 MED ORDER — FENTANYL CITRATE (PF) 100 MCG/2ML IJ SOLN
12.5000 ug | INTRAMUSCULAR | Status: DC | PRN
  Administered 2016-10-20: 50 ug via INTRAVENOUS
  Administered 2016-10-20: 25 ug via INTRAVENOUS
  Administered 2016-10-20 – 2016-10-21 (×3): 50 ug via INTRAVENOUS
  Administered 2016-10-21: 25 ug via INTRAVENOUS
  Administered 2016-10-21: 12.5 ug via INTRAVENOUS
  Filled 2016-10-20 (×8): qty 2

## 2016-10-20 MED ORDER — DEXTROSE 5 % IV SOLN
1.0000 g | INTRAVENOUS | Status: DC
  Administered 2016-10-20 – 2016-10-21 (×2): 1 g via INTRAVENOUS
  Filled 2016-10-20 (×2): qty 10

## 2016-10-20 MED ORDER — ONDANSETRON HCL 4 MG/2ML IJ SOLN
4.0000 mg | Freq: Once | INTRAMUSCULAR | Status: AC
  Administered 2016-10-20: 4 mg via INTRAVENOUS

## 2016-10-20 MED ORDER — CHLORHEXIDINE GLUCONATE 0.12 % MT SOLN
15.0000 mL | Freq: Two times a day (BID) | OROMUCOSAL | Status: DC
  Administered 2016-10-20 – 2016-10-26 (×14): 15 mL via OROMUCOSAL
  Filled 2016-10-20 (×8): qty 15

## 2016-10-20 MED ORDER — SODIUM CHLORIDE 0.9 % IV SOLN: 250.0000 mL | INTRAVENOUS | Status: DC | PRN

## 2016-10-20 MED ORDER — LABETALOL HCL 5 MG/ML IV SOLN
10.0000 mg | INTRAVENOUS | Status: AC | PRN
  Administered 2016-10-20 – 2016-10-21 (×10): 10 mg via INTRAVENOUS
  Filled 2016-10-20 (×5): qty 4

## 2016-10-20 MED ORDER — SODIUM CHLORIDE 0.9% FLUSH
3.0000 mL | Freq: Two times a day (BID) | INTRAVENOUS | Status: DC
  Administered 2016-10-20 (×2): 3 mL via INTRAVENOUS

## 2016-10-20 MED ORDER — PANTOPRAZOLE SODIUM 40 MG IV SOLR
40.0000 mg | INTRAVENOUS | Status: DC
  Administered 2016-10-20 – 2016-10-25 (×6): 40 mg via INTRAVENOUS
  Filled 2016-10-20 (×6): qty 40

## 2016-10-20 NOTE — ED Notes (Signed)
Repaged Neurosurgery

## 2016-10-20 NOTE — H&P (Signed)
PULMONARY / CRITICAL CARE MEDICINE   Name: Betty Floyd MRN: [id number] DOB: Oct 22, 1932    ADMISSION DATE:  10/06/2016 CONSULTATION DATE:  10/20/16  REFERRING MD:  Robb Matar  CHIEF COMPLAINT:  Fall  HISTORY OF PRESENT ILLNESS:  Pt is encephelopathic; therefore, this HPI is obtained from chart review. Betty Floyd is a 83 y.o. female with PMH as outlined below. She was trying to get back into bed on evening of 10/24/2016 after trying to pick up a piece of paper from the floor when she suffered a mechanical fall causing her to strike the back of her head on the floor. She was brought to [facility] ED where [facility] demonstrated SAH.  She is on plavix as well as pletal. While in ED, she had change in neuro status within 3 hours so repeat CTH was obtained.  This unfortunately demonstrated increase in size of SAH.  She was given platelet transfusion and neurosurgery was consulted.  She was given keppra for seizure prophylaxis as well as 2 units of platelets.  PCCM was called for admission.  Of note, she has out of hospital DNR in place.  PAST MEDICAL HISTORY :  She  has a past medical history of Aortic valve stenosis, moderate (2014); CAD (coronary artery disease) of artery bypass graft (February 2004 ); CAD, multiple vessel (Progression until 2000); Carotid artery disease -left (Most recent Dopplers July 2014); Chronic back pain; Gastritis; Gastroparesis; H/O echocardiogram (July 2014); H/O unstable angina (February 2004); History of nuclear stress test ( September 2012); Hyperlipidemia LDL goal <70; Hypertension; Occlusive mesenteric ischemia (HCC) ( 2007; Doppler July 2014); Osteoarthritis of back; PAD (peripheral artery disease) (HCC) (Most recent Doppler July 2014); Peptic ulcer disease; Posterior MI (HCC) (1976); Right renal artery stenosis (HCC) (2004; most recent Doppler July 2014); S/P CABG x 3 (2000); S/P cardiac catheterization ( May 2010); Small bowel obstruction due to adhesions (October 2004);  and Subclavian artery stenosis, left (HCC) (Most recent Dopplers 2014).  PAST SURGICAL HISTORY: She  has a past surgical history that includes Coronary artery bypass graft (2000); Colon surgery (2004); Carotid endarterectomy (Left); pr vein bypass graft,aorto-fem-pop (1998); Carotid artery - subclavian artery bypass graft (Left); Renal artery stent (Right, 2004); Superior mestenteric artery stent (2007); Coronary stent placement (2004); Cardiac catheterization (2010); Laparoscopic lysis of adhesions (October 2004); Aortobifem (1998); and Femoral-tibial Bypass Graft (Left).  Allergies  Allergen Reactions  . Oxycodone Other (See Comments)    Causes Hallucinations makes her go crazy.  Patient seems to think that she can take med very short term.   . Contrast Media [Iodinated Diagnostic Agents] Hives, Itching and Rash    Benadryl works to counteract reaction  . Iohexol Hives, Itching and Rash    01-11-06---pt had erythema & mild itching with 13 hr prep, Onset Date: [id number]   . Promethazine Hives, Itching and Rash  . Vicodin [Hydrocodone-Acetaminophen] Hives, Itching and Rash    No current facility-administered medications on file prior to encounter.    Current Outpatient Prescriptions on File Prior to Encounter  Medication Sig  . acetaminophen (TYLENOL) 500 MG tablet Take 1,000 mg by mouth every 8 (eight) hours.   . ALPRAZolam (XANAX) 0.25 MG tablet Take 0.5 mg by mouth at bedtime as needed for sleep.  Marland Kitchen aspirin 81 MG tablet Take 81 mg by mouth every morning.   Marland Kitchen atorvastatin (LIPITOR) 20 MG tablet Take 20 mg by mouth every morning.   . Cholecalciferol (VITAMIN D3) 1000 UNITS CAPS Take 1,000 Units by mouth daily.   Marland Kitchen  cilostazol (PLETAL) 100 MG tablet Take 100 mg by mouth 2 (two) times daily.    . clopidogrel (PLAVIX) 75 MG tablet Take 75 mg by mouth daily.   . DULoxetine (CYMBALTA) 60 MG capsule Take 60 mg by mouth every evening.   . fenofibrate 160 MG tablet Take 160 mg by mouth every  morning.   . isosorbide mononitrate (IMDUR) 30 MG 24 hr tablet Take 30 mg by mouth daily at 6 PM.   . losartan (COZAAR) 50 MG tablet Take 50 mg by mouth every morning.   . metoprolol tartrate (LOPRESSOR) 25 MG tablet Take 25 mg by mouth 2 (two) times daily.    Marland Kitchen. NITROSTAT 0.4 MG SL tablet Place 0.4 mg under the tongue every 5 (five) minutes as needed for chest pain.   . Omega-3 Fatty Acids (FISH OIL) 1000 MG CAPS Take 1 capsule by mouth every morning.   Marland Kitchen. oxyCODONE (OXY IR/ROXICODONE) 5 MG immediate release tablet Take 5 mg by mouth every 6 (six) hours as needed for pain.  . pantoprazole (PROTONIX) 40 MG tablet Take 40 mg by mouth 2 (two) times daily.    . polyethylene glycol (MIRALAX / GLYCOLAX) packet Take 17-34 g by mouth every morning.   . pregabalin (LYRICA) 100 MG capsule Take 100 mg by mouth 3 (three) times daily.    . temazepam (RESTORIL) 15 MG capsule Take 30 mg by mouth at bedtime as needed for sleep.   . traMADol (ULTRAM) 50 MG tablet Take 100 mg by mouth every 6 (six) hours.     FAMILY HISTORY:  Her indicated that her mother is deceased. She indicated that her father is deceased. She indicated that her sister is alive. She indicated that only one of her three brothers is alive. She indicated that both of her daughters are alive.    SOCIAL HISTORY: She  reports that she quit smoking about 30 years ago. Her smoking use included Cigarettes. She has a 20.00 pack-year smoking history. She has never used smokeless tobacco. She reports that she does not drink alcohol or use drugs.  REVIEW OF SYSTEMS:   Unable to obtain as pt is dysphasic.  SUBJECTIVE:  Awake, unable to participate in exam.  VITAL SIGNS: BP 172/59   Pulse 96   Temp 99.3 F (37.4 C) (Oral)   Resp 25   SpO2 95%   HEMODYNAMICS:    VENTILATOR SETTINGS:    INTAKE / OUTPUT: No intake/output data recorded.   PHYSICAL EXAMINATION: General: Elderly female, in NAD. Neuro: Awake but aphasic, unable to follow  commands.  Posterior scalp laceration, bleeding appears to be controlled. HEENT: [place]/AT. PERRL, sclerae anicteric. Cardiovascular: RRR, no M/R/G.  Lungs: Respirations even and unlabored.  CTA bilaterally, No W/R/R. Abdomen: BS x 4, soft, NT/ND.  Musculoskeletal: No gross deformities, no edema.  Skin: Intact, warm, no rashes.  LABS:  BMET  Recent Labs Lab 10/20/16 0037  NA 140  K 4.0  CL 102  CO2 28  BUN 17  CREATININE 1.09*  GLUCOSE 115*    Electrolytes  Recent Labs Lab 10/20/16 0037  CALCIUM 9.5    CBC  Recent Labs Lab 10/20/16 0037  WBC 4.1  HGB 12.1  HCT 38.2  PLT 129*    Coag's  Recent Labs Lab 10/20/16 0037  INR 1.03    Sepsis Markers No results for input(s): LATICACIDVEN, PROCALCITON, O2SATVEN in the last 168 hours.  ABG No results for input(s): PHART, PCO2ART, PO2ART in the last 168 hours.  Liver Enzymes No results for input(s): AST, ALT, ALKPHOS, BILITOT, ALBUMIN in the last 168 hours.  Cardiac Enzymes No results for input(s): TROPONINI, PROBNP in the last 168 hours.  Glucose No results for input(s): GLUCAP in the last 168 hours.  Imaging Dg Lumbar Spine Complete  Result Date: 10/20/2016 CLINICAL DATA:  Fall with low back pain EXAM: LUMBAR SPINE - COMPLETE 4+ VIEW COMPARISON:  CT 04/10/2012 FINDINGS: Surgical clips in the right upper quadrant. SI joints are symmetric. Surgical clips in the retroperitoneum and groins. Vascular stents overlie L1 and L2. Lumbar alignment within normal limits. Vertebral body are grossly maintained. Moderate severe narrowing at L4-L5 and L5-S1 with endplate changes and osteophyte. IMPRESSION: 1. Degenerative changes.  No definite acute osseous abnormality. 2. Atherosclerotic vascular calcifications. Electronically Signed   By: Jasmine Pang M.D.   On: 10/20/2016 00:20   Ct Head Wo Contrast  Result Date: 10/20/2016 CLINICAL DATA:  Subarachnoid hemorrhage EXAM: CT HEAD WITHOUT CONTRAST TECHNIQUE: Contiguous  axial images were obtained from the base of the skull through the vertex without intravenous contrast. COMPARISON:  Head CT 10/20/2016 at 12:01 a.m. FINDINGS: Brain: Left frontal subarachnoid hemorrhage has increased from the prior examination. The area of hemorrhage now measures up to 4.5 cm in AP dimension. There may be an associated parenchymal component. There is increased mass effect on the frontal horn of left lateral ventricle. There is increase rightward bulging of the cerebral falx. No midline shift. No hydrocephalus. Vascular: No hyperdense vessel or unexpected calcification. Skull: Laceration and subgaleal hematoma of the left parietal scalp is again seen. No focal calvarial abnormality. Sinuses/Orbits: Paranasal sinuses and mastoids are free of fluid. Normal orbits. Other: None. IMPRESSION: 1. Increasing size of subarachnoid hematoma overlying the left frontal lobe. An associated intraparenchymal component would be difficult to exclude. 2. Increased mass effect on the frontal horn of the left lateral ventricle, but no hydrocephalus or midline shift. These results will be called to the ordering clinician or representative by the Radiologist Assistant, and communication documented in the PACS or zVision Dashboard. Electronically Signed   By: Deatra Robinson M.D.   On: 10/20/2016 03:50   Ct Head Wo Contrast  Result Date: 10/20/2016 CLINICAL DATA:  Status post fall. Hit back of head on floor. Patient on blood thinners. Initial encounter. EXAM: CT HEAD WITHOUT CONTRAST TECHNIQUE: Contiguous axial images were obtained from the base of the skull through the vertex without intravenous contrast. COMPARISON:  CT of the head performed 08/14/2009, and MRI of the brain from 08/16/2009 FINDINGS: Brain: Focal acute subarachnoid hemorrhage is noted along the medial left frontal lobe, with trace blood tracking along the left anterior falx cerebri. No evidence of acute infarction, hydrocephalus, extra-axial collection  or mass lesion. Prominence of the ventricles and sulci reflects mild cortical volume loss. Mild cerebellar atrophy is noted. Scattered periventricular and subcortical white matter change likely reflects small vessel ischemic microangiopathy. The brainstem and fourth ventricle are within normal limits. The basal ganglia are unremarkable in appearance. No midline shift is seen. Vascular: No hyperdense vessel or unexpected calcification. Skull: There is no evidence of fracture; visualized osseous structures are unremarkable in appearance. Sinuses/Orbits: The orbits are within normal limits. The paranasal sinuses and mastoid air cells are well-aerated. Other: Soft tissue swelling is noted at the posterior vertex. IMPRESSION: 1. Focal acute subarachnoid hemorrhage along the medial left frontal lobe, with trace blood tracking along the left anterior falx cerebri. 2. Soft tissue swelling at the posterior vertex. 3. Mild cortical volume  loss and scattered small vessel ischemic microangiopathy. Critical Value/emergent results were called by telephone at the time of interpretation on 10/20/2016 at 12:34 am to Dr. Jaci Carrel, who verbally acknowledged these results. Electronically Signed   By: Roanna Raider M.D.   On: 10/20/2016 00:35     STUDIES:  CTH 02/22 > focal acute SAH along medial left frontal lobe. Soft tissue swelling at posterior vertex. CTH 02/22 > increase in size of SAH over left frontal lobe with increase mass effect.  No hydrocephalus or MLS.  CULTURES: None.  ANTIBIOTICS: None.  SIGNIFICANT EVENTS: 02/22 > admit.  LINES/TUBES: None.  DISCUSSION: 83 y.o. female admitted 02/22 after mechanical fall with subsequent SAH.  While in ED, had change in mental status and CTH demonstrated worsening SAH.  Seen by neurosurgery and pt deemed not a surgical candidate. PCCM asked to admit to ICU for frequent neuro checks.  ASSESSMENT / PLAN:  NEUROLOGIC A:   Traumatic SAH - complicated as  pt on dual antiplatelet agents (plavix, pletal) and unfortunately now with increase in size of SAH. Hx chronic back pain. P:   Neurosurgery following, not a surgical candidate at this time. Continue keppra for seizure prophylaxis. Frequent neuro checks. Hold preadmission alprazolam, duloxetine, oxycodone, pregabalin, temazepam.  PULMONARY A: No acute issues. P:   No interventions required.  CARDIOVASCULAR A:  Hx left subclavian stenosis (100% occlusion), CAD s/p cardiac cath (2010), MI s/p CABG x 3 (2000), PAD, HTN, HLD, unstable angina, Aortic stenosis. DNR Status. P:  Labetalol PRN and Hydralazine PRN, goal SBP < 160. Hold preadmission ASA, cilostazol, imdur, losartan, lopressor.  RENAL A:   AKI. P:   NS @ 50. BMP in AM.  GASTROINTESTINAL A:   Nutrition. Hx gastroparesis. P:   NPO. Continue preadmission PPI.  HEMATOLOGIC A:   VTE Prophylaxis. P:  SCD's only. CBC in AM.  INFECTIOUS A:   No indication of infection. P:   Monitor clinically.  ENDOCRINE A:   No acute issues. P:   No interventions required.   Family updated: Daughter updated at bedside.  Interdisciplinary Family Meeting v Palliative Care Meeting:  Due by: 10/26/16.  CC time: 30 min.   Rutherford Guys, [place] [facility] Pager: [phone]  or [phone] 10/20/2016, 5:21 AM   ATTENDING NOTE / ATTESTATION NOTE :   I have discussed the case with the resident/APP  Rutherford Guys PA.   I agree with the resident/APP's  history, physical examination, assessment, and plans.    I have edited the above note and modified it according to our agreed history, physical examination, assessment and plan.   Briefly, Betty Floyd is a 83 y.o. Female, who was brought  to ED secondary to fall.  She was trying to get back into bed on evening of 2016/10/23 after trying to pick up a piece of paper from the floor when she suffered a mechanical fall causing her to  strike the back of her head on the floor. She was brought to [facility] ED where [facility] demonstrated SAH.  She is on plavix as well as pletal. While in ED, she had change in neuro status within 3 hours so repeat CTH was obtained.  This unfortunately demonstrated increase in size of SAH.  She was given platelet transfusion and neurosurgery was consulted.  She was given keppra for seizure prophylaxis as well as 2 units of platelets.  PCCM was called for admission.  Vitals:  Vitals:   10/20/16 0434 10/20/16 0447 10/20/16 0500 10/20/16 0530  BP: 142/83 163/76 172/59 172/87  Pulse: 98 94 96 97  Resp: 21 23 25 23   Temp: 99.1 F (37.3 C) 99.3 F (37.4 C)    TempSrc: Oral Oral    SpO2: 96% 96% 95% 93%    Constitutional/General:  chronicall ill lady, not in any distress. Non verbal.   There is no height or weight on file to calculate BMI. Wt Readings from Last 3 Encounters:  06/01/15 70.9 kg (156 lb 4.8 oz)  01/16/14 70.2 kg (154 lb 12.8 oz)  08/06/13 68.9 kg (151 lb 14.4 oz)    HEENT: PERLA, anicteric sclerae. (-) Oral thrush.   Neck: No masses. Midline trachea. No JVD, (-) LAD. (-) bruits appreciated.  Respiratory/Chest: Grossly normal chest. (-) deformity. (-) Accessory muscle use.  Symmetric expansion. Diminished BS on both lower lung zones. (-) wheezing, crackles, rhonchi (-) egophony  Cardiovascular: Regular rate and  rhythm, heart sounds normal, no murmur or gallops,  (-) edema  Gastrointestinal:  Normal bowel sounds. Soft, non-tender. No hepatosplenomegaly.  (-) masses.   Musculoskeletal:  Normal muscle tone.   Extremities: Grossly normal. (-) clubbing, cyanosis.  (-) edema  Skin: (-) rash,lesions seen.   Neurological/Psychiatric : Non verbal. Preferential gaze to the right. Does not follow commands.     CBC Recent Labs     10/20/16  0037  WBC  4.1  HGB  12.1  HCT  38.2  PLT  129*    Coag's Recent Labs     10/20/16  0037  INR  1.03    BMET Recent Labs      10/20/16  0037  NA  140  K  4.0  CL  102  CO2  28  BUN  17  CREATININE  1.09*  GLUCOSE  115*    Electrolytes Recent Labs     10/20/16  0037  CALCIUM  9.5    Sepsis Markers No results for input(s): PROCALCITON, O2SATVEN in the last 72 hours.  Invalid input(s): LACTICACIDVEN  ABG No results for input(s): PHART, PCO2ART, PO2ART in the last 72 hours.  Liver Enzymes No results for input(s): AST, ALT, ALKPHOS, BILITOT, ALBUMIN in the last 72 hours.  Cardiac Enzymes No results for input(s): TROPONINI, PROBNP in the last 72 hours.  Glucose No results for input(s): GLUCAP in the last 72 hours.  Imaging Dg Lumbar Spine Complete  Result Date: 10/20/2016 CLINICAL DATA:  Fall with low back pain EXAM: LUMBAR SPINE - COMPLETE 4+ VIEW COMPARISON:  CT 04/10/2012 FINDINGS: Surgical clips in the right upper quadrant. SI joints are symmetric. Surgical clips in the retroperitoneum and groins. Vascular stents overlie L1 and L2. Lumbar alignment within normal limits. Vertebral body are grossly maintained. Moderate severe narrowing at L4-L5 and L5-S1 with endplate changes and osteophyte. IMPRESSION: 1. Degenerative changes.  No definite acute osseous abnormality. 2. Atherosclerotic vascular calcifications. Electronically Signed   By: Jasmine Pang M.D.   On: 10/20/2016 00:20   Ct Head Wo Contrast  Result Date: 10/20/2016 CLINICAL DATA:  Subarachnoid hemorrhage EXAM: CT HEAD WITHOUT CONTRAST TECHNIQUE: Contiguous axial images were obtained from the base of the skull through the vertex without intravenous contrast. COMPARISON:  Head CT 10/20/2016 at 12:01 a.m. FINDINGS: Brain: Left frontal subarachnoid hemorrhage has increased from the prior examination. The area of hemorrhage now measures up to 4.5 cm in AP dimension. There may be an associated parenchymal component. There is increased mass effect on  the frontal horn of left lateral ventricle. There is increase rightward bulging of the cerebral  falx. No midline shift. No hydrocephalus. Vascular: No hyperdense vessel or unexpected calcification. Skull: Laceration and subgaleal hematoma of the left parietal scalp is again seen. No focal calvarial abnormality. Sinuses/Orbits: Paranasal sinuses and mastoids are free of fluid. Normal orbits. Other: None. IMPRESSION: 1. Increasing size of subarachnoid hematoma overlying the left frontal lobe. An associated intraparenchymal component would be difficult to exclude. 2. Increased mass effect on the frontal horn of the left lateral ventricle, but no hydrocephalus or midline shift. These results will be called to the ordering clinician or representative by the Radiologist Assistant, and communication documented in the PACS or zVision Dashboard. Electronically Signed   By: Deatra Robinson M.D.   On: 10/20/2016 03:50   Ct Head Wo Contrast  Result Date: 10/20/2016 CLINICAL DATA:  Status post fall. Hit back of head on floor. Patient on blood thinners. Initial encounter. EXAM: CT HEAD WITHOUT CONTRAST TECHNIQUE: Contiguous axial images were obtained from the base of the skull through the vertex without intravenous contrast. COMPARISON:  CT of the head performed 08/14/2009, and MRI of the brain from 08/16/2009 FINDINGS: Brain: Focal acute subarachnoid hemorrhage is noted along the medial left frontal lobe, with trace blood tracking along the left anterior falx cerebri. No evidence of acute infarction, hydrocephalus, extra-axial collection or mass lesion. Prominence of the ventricles and sulci reflects mild cortical volume loss. Mild cerebellar atrophy is noted. Scattered periventricular and subcortical white matter change likely reflects small vessel ischemic microangiopathy. The brainstem and fourth ventricle are within normal limits. The basal ganglia are unremarkable in appearance. No midline shift is seen. Vascular: No hyperdense vessel or unexpected calcification. Skull: There is no evidence of fracture; visualized  osseous structures are unremarkable in appearance. Sinuses/Orbits: The orbits are within normal limits. The paranasal sinuses and mastoid air cells are well-aerated. Other: Soft tissue swelling is noted at the posterior vertex. IMPRESSION: 1. Focal acute subarachnoid hemorrhage along the medial left frontal lobe, with trace blood tracking along the left anterior falx cerebri. 2. Soft tissue swelling at the posterior vertex. 3. Mild cortical volume loss and scattered small vessel ischemic microangiopathy. Critical Value/emergent results were called by telephone at the time of interpretation on 10/20/2016 at 12:34 am to Dr. Jaci Carrel, who verbally acknowledged these results. Electronically Signed   By: Roanna Raider M.D.   On: 10/20/2016 00:35    Assessment/Plan: Traumatic SAH, medial L frontal lobe secondary to a Fall.  Pt on dual antiplatelet therapy with plavix and pletal - Neurosurgery has seen the patient.  No note of ICH, midline shift, or any compromise of basal cisterns as of this moment - Continue with Keppra for seizure prophylaxis - S/P platelets.  - Observe in Neuro ICU.  - Will need a repeat cranial CT scan if with further decline of neuro/mental status to help with prognostication.  - Not an operative candidate. - pt is a DNR - Keep NPO.  - Will need a dubhoff tube for nutrition    HTN - hydralazine and labetalol prn - keep SBP < 160 mm Hg   AKI - IVF 50 mls/hr   I spent  30  minutes of Critical Care time with this patient today. This is my time spent independent of the APP or resident.   Family :Family updated at length today.  Daughter extensively updated on 10/20/16.  She is waiting on a brother from [facility].   Pt is  a DNR.     Pollie Meyer, MD 10/20/2016, 6:13 AM [facility] Pager [phone] After 3 pm or if no answer, call [phone]

## 2016-10-20 NOTE — Progress Notes (Addendum)
Seen by PCCM few h ours ago. Daughter with more questions  S: Per RN and daughter - no change bu tneeding morphine for moaning that seems fall pain related  Daughter says she is midway through antibiotic course with PCP Martha Clan, MD   O Vitals:   10/20/16 0800 10/20/16 0830 10/20/16 0900 10/20/16 0930  BP: (!) 134/45 (!) 149/57 (!) 135/50 (!) 123/57  Pulse: 90 93 91 98  Resp: 16 (!) 22 (!) 25 19  Temp:      TempSrc:      SpO2: 95% 99% 98% 99%    Frail Moans Mutters No distress RASS -3 equivalent   PULMONARY No results for input(s): PHART, PCO2ART, PO2ART, HCO3, TCO2, O2SAT in the last 168 hours.  Invalid input(s): PCO2, PO2  CBC  Recent Labs Lab 10/20/16 0037  HGB 12.1  HCT 38.2  WBC 4.1  PLT 129*    COAGULATION  Recent Labs Lab 10/20/16 0037  INR 1.03    CARDIAC  No results for input(s): TROPONINI in the last 168 hours. No results for input(s): PROBNP in the last 168 hours.   CHEMISTRY  Recent Labs Lab 10/20/16 0037  NA 140  K 4.0  CL 102  CO2 28  GLUCOSE 115*  BUN 17  CREATININE 1.09*  CALCIUM 9.5   CrCl cannot be calculated (Unknown ideal weight.).   LIVER  Recent Labs Lab 10/20/16 0037  INR 1.03     INFECTIOUS No results for input(s): LATICACIDVEN, PROCALCITON in the last 168 hours.   ENDOCRINE CBG (last 3)  No results for input(s): GLUCAP in the last 72 hours.       IMAGING x48h  - image(s) personally visualized  -   highlighted in bold Dg Lumbar Spine Complete  Result Date: 10/20/2016 CLINICAL DATA:  Fall with low back pain EXAM: LUMBAR SPINE - COMPLETE 4+ VIEW COMPARISON:  CT 04/10/2012 FINDINGS: Surgical clips in the right upper quadrant. SI joints are symmetric. Surgical clips in the retroperitoneum and groins. Vascular stents overlie L1 and L2. Lumbar alignment within normal limits. Vertebral body are grossly maintained. Moderate severe narrowing at L4-L5 and L5-S1 with endplate changes and osteophyte.  IMPRESSION: 1. Degenerative changes.  No definite acute osseous abnormality. 2. Atherosclerotic vascular calcifications. Electronically Signed   By: Jasmine Pang M.D.   On: 10/20/2016 00:20   Ct Head Wo Contrast  Result Date: 10/20/2016 CLINICAL DATA:  Subarachnoid hemorrhage EXAM: CT HEAD WITHOUT CONTRAST TECHNIQUE: Contiguous axial images were obtained from the base of the skull through the vertex without intravenous contrast. COMPARISON:  Head CT 10/20/2016 at 12:01 a.m. FINDINGS: Brain: Left frontal subarachnoid hemorrhage has increased from the prior examination. The area of hemorrhage now measures up to 4.5 cm in AP dimension. There may be an associated parenchymal component. There is increased mass effect on the frontal horn of left lateral ventricle. There is increase rightward bulging of the cerebral falx. No midline shift. No hydrocephalus. Vascular: No hyperdense vessel or unexpected calcification. Skull: Laceration and subgaleal hematoma of the left parietal scalp is again seen. No focal calvarial abnormality. Sinuses/Orbits: Paranasal sinuses and mastoids are free of fluid. Normal orbits. Other: None. IMPRESSION: 1. Increasing size of subarachnoid hematoma overlying the left frontal lobe. An associated intraparenchymal component would be difficult to exclude. 2. Increased mass effect on the frontal horn of the left lateral ventricle, but no hydrocephalus or midline shift. These results will be called to the ordering clinician or representative by the Radiologist Assistant,  and communication documented in the PACS or zVision Dashboard. Electronically Signed   By: Kevin  Herman M.D.   On: 10/20/2016 03:50   Ct Head Wo Contrast  Result Date: 10/20/2016 CLINICAL DATA:  StatDeatra Robinsonus post fall. Hit back of head on floor. Patient on blood thinners. Initial encounter. EXAM: CT HEAD WITHOUT CONTRAST TECHNIQUE: Contiguous axial images were obtained from the base of the skull through the vertex without  intravenous contrast. COMPARISON:  CT of the head performed 08/14/2009, and MRI of the brain from 08/16/2009 FINDINGS: Brain: Focal acute subarachnoid hemorrhage is noted along the medial left frontal lobe, with trace blood tracking along the left anterior falx cerebri. No evidence of acute infarction, hydrocephalus, extra-axial collection or mass lesion. Prominence of the ventricles and sulci reflects mild cortical volume loss. Mild cerebellar atrophy is noted. Scattered periventricular and subcortical white matter change likely reflects small vessel ischemic microangiopathy. The brainstem and fourth ventricle are within normal limits. The basal ganglia are unremarkable in appearance. No midline shift is seen. Vascular: No hyperdense vessel or unexpected calcification. Skull: There is no evidence of fracture; visualized osseous structures are unremarkable in appearance. Sinuses/Orbits: The orbits are within normal limits. The paranasal sinuses and mastoid air cells are well-aerated. Other: Soft tissue swelling is noted at the posterior vertex. IMPRESSION: 1. Focal acute subarachnoid hemorrhage along the medial left frontal lobe, with trace blood tracking along the left anterior falx cerebri. 2. Soft tissue swelling at the posterior vertex. 3. Mild cortical volume loss and scattered small vessel ischemic microangiopathy. Critical Value/emergent results were called by telephone at the time of interpretation on 10/20/2016 at 12:34 am to Dr. Jaci CarrelHRISTOPHER POLLINA, who verbally acknowledged these results. Electronically Signed   By: Roanna RaiderJeffery  Chang M.D.   On: 10/20/2016 00:35   Dg Chest Port 1 View  Result Date: 10/20/2016 CLINICAL DATA:  Pain following fall EXAM: PORTABLE CHEST 1 VIEW COMPARISON:  March 14, 2013 FINDINGS: There is slight left base atelectasis. Lungs elsewhere clear. Heart is mildly enlarged with pulmonary vascularity within normal limits. There is evidence of prior coronary artery bypass grafting. There  is also surgery in the left paratracheal and neck regions. There is aortic atherosclerosis. No pneumothorax. No bone lesions. No evident adenopathy. IMPRESSION: Slight left base atelectasis. Lungs elsewhere clear. Stable cardiomegaly. There is aortic atherosclerosis. No pneumothorax. Electronically Signed   By: Bretta BangWilliam  Woodruff III M.D.   On: 10/20/2016 07:35    A) SAH - per NSGY. Need to clarify if NSGY wants ddvap - UTI opd issue - start ceftriaxone. Need to find from PCP Martha ClanShaw, William, MD clture result - Pain - change morphine to low dose fentanyl - dauhter updated    Dr. Kalman ShanMurali Anahli Arvanitis, M.D., [facility].C.P Pulmonary and Critical Care Medicine Staff Physician [facility] Pager: [phone], If no answer or between  15:00h - 7:00h: call [phone]  10/20/2016 9:58 AM

## 2016-10-20 NOTE — ED Notes (Signed)
Daughter continues to be at bedside and is very supportive.

## 2016-10-20 NOTE — Evaluation (Signed)
Clinical/Bedside Swallow Evaluation Patient Details  Name: Betty Floyd MRN: [id number] Date of Birth: 1933-04-22  Today's Date: 10/20/2016 Time: SLP Start Time (ACUTE ONLY): 1017 SLP Stop Time (ACUTE ONLY): 1030 SLP Time Calculation (min) (ACUTE ONLY): 13 min  Past Medical History:  Past Medical History:  Diagnosis Date  . Aortic valve stenosis, moderate 2014   Calcific stenosis with increased velocities in the mid to distal aorta consistent with the greater than 60% diameter reduction.  Marland Kitchen. CAD (coronary artery disease) of artery bypass graft February 2004    Occluded radial-OM graft; DES PCI proximal and mid Circumflex (mid: 2.5 mm 18 mm Cypher DES, proximal 2.5 mm x 13 mm Cypher DES)  . CAD, multiple vessel Progression until 2000   Referred for CABG x3 by Dr. Donata ClayVan Trigt  . Carotid artery disease -left Most recent Dopplers July 2014   History of left carotid angioplasty (Dr. Edilia Boickson); carotid Dopplers July 2014: Bilateral ICAs less than 49% reduction. Patent left common carotid-subclavian artery bypass  . Chronic back pain   . Gastritis   . Gastroparesis   . H/O echocardiogram July 2014   EF 50-55%; mild LVH. Grade 1 diastolic dysfunction. Moderate HJ of inferolateral wall. Mild AI. Mild MR.   . H/O unstable angina February 2004   Diagnostic cath showed severe circumflex disease with occluded radial graft to OM  . History of nuclear stress test  September 2012   LexiScan Myoview: Large size, moderate-severe intensity defect in the basal inferoseptal, basal inferior walls as well as basal inferolateral mid inferolateral/apical lateral walls with mild reversibility. Thought to be mild hypokinesis in this distribution but inconsistent with size of defect. Would suggest viable myocardium  . Hyperlipidemia LDL goal <70   . Hypertension     partially renovascular  . Occlusive mesenteric ischemia (HCC)  2007; Doppler July 2014   BMS - SMA; Dr. Fredia SorrowYamagata (VIR); followup Dopplers July 2014:  SMA and celiac arteries flow velocities consistent with greater than 60% diameter reduction  . Osteoarthritis of back     and Knees  . PAD (peripheral artery disease) (HCC) Most recent Doppler July 2014    extensive bilateral disease; R>L; Multiple bypass operations: AortoBifem (1998), R Fem-Pop; R Fem-tib; Fem-Fem -- occluded; 03/17/2013  Dopplerrs: R. ABI 0; left ABI 0.06. Right CFA, SFA-, femoropopliteal, fem-tib occluded; R. PT, AT-DP occluded.  LEFT distal EIA high resistance, CFA<50%,  peroneal not visualized -  progression of L. ABI (followup 6 months)   . Peptic ulcer disease   . Posterior MI (HCC) 1976   While on OCPs; age 83  . Right renal artery stenosis (HCC) 2004; most recent Doppler July 2014   PCI: Genesis 6.0 mm x 12 mm; abdominal vessel Dopplers July 2014: Right proximal renal artery at stent equal to or less than 60%; left proximal renal artery less than 50%. Right and left kidneys are equal size and symmetric. Left lower kidney pole with a 0.7 cm x 2.9 centimeter cyst  . S/P CABG x 3 2000   LIMA-LAD, SVG-RPDA, LRad-OM (known to be occluded)  . S/P cardiac catheterization  May 2010   Patent pedicled LIMA-LAD, patent SVG-PDA, patent circumflex stents. Also patent left carotid-left subclavian bypass;; apical and distal inferior posterior apical hypokinesis  . Small bowel obstruction due to adhesions October 2004   Status post LOA  . Subclavian artery stenosis, left (HCC) Most recent Dopplers 2014    100% occlusion; status post left carotid-subclavian bypass (no evidence of steal phenomenon by  cath in 2010, with adequate LIMA flow)   Past Surgical History:  Past Surgical History:  Procedure Laterality Date  . Aortobifem  1998   Dr. Micah Noel  . CARDIAC CATHETERIZATION  2010   Patent LIMA-LAD with good distal flow to diagonals; patent SVG-RPDA, patent circumflex stents. Patent left carotid-subclavian bypass with patent LIMA-LAD  . CAROTID ARTERY - SUBCLAVIAN ARTERY BYPASS  GRAFT Left    Dr. Edilia Bo  . CAROTID ENDARTERECTOMY Left    Dr. Arbie Cookey  . COLON SURGERY  2004   r/t blockage  . CORONARY ARTERY BYPASS GRAFT  2000   LIMA-LAD, SVG-RPDA, left radial-OM  . CORONARY STENT PLACEMENT  2004   (for occluded radial-OM graft) Proximal circumflex 2.5 mm x 13 mm Cypher DES; mid circumflex 2.5 mm x 18 mm Cypher DES  . FEMORAL-TIBIAL BYPASS GRAFT Left   . LAPAROSCOPIC LYSIS OF ADHESIONS  October 2004  . PR VEIN BYPASS GRAFT,AORTO-FEM-POP  1998   right leg  . RENAL ARTERY STENT Right 2004   Dr. Alanda Amass 6 mm x 12 mm Genesis  . SUPERIOR MESTENTERIC ARTERY STENT  2007   Dr. Fredia Sorrow   HPI:  83 yo female admitted s/p fall with SAH in the left frontal lobe. Pt developed aphasia in ED with CT showing increased size of hematoma. PMH includes CABG x3, MI, PAD, HTN, gastroparesis, CAD   Assessment / Plan / Recommendation Clinical Impression  Pt is too lethargic for safe PO intake. She mostly purses her lips during attempted oral care, but she did wake up to take in two pieces of ice before falling asleep again. No overt signs of aspiration were observed, but vocal quality could not be assessed. SLP will follow for additional diagnostic PO trials as alertness improves. SLP Visit Diagnosis: Dysphagia, unspecified (R13.10)    Aspiration Risk  Moderate aspiration risk    Diet Recommendation NPO   Medication Administration: Via alternative means    Other  Recommendations Oral Care Recommendations: Oral care QID Other Recommendations: Have oral suction available   Follow up Recommendations  (tba)      Frequency and Duration min 2x/week  2 weeks       Prognosis Prognosis for Safe Diet Advancement: Good Barriers to Reach Goals: Language deficits      Swallow Study   General HPI: 83 yo female admitted s/p fall with SAH in the left frontal lobe. Pt developed aphasia in ED with CT showing increased size of hematoma. PMH includes CABG x3, MI, PAD, HTN,  gastroparesis, CAD Type of Study: Bedside Swallow Evaluation Previous Swallow Assessment: none in chart Diet Prior to this Study: NPO Temperature Spikes Noted: No Respiratory Status: Nasal cannula History of Recent Intubation: No Behavior/Cognition: Lethargic/Drowsy Oral Cavity Assessment: Other (comment) (difficult to see, does not open mouth much) Oral Care Completed by SLP: Yes (minimally, pt purses her lips) Oral Cavity - Dentition: Adequate natural dentition Self-Feeding Abilities: Total assist Patient Positioning: Upright in bed Baseline Vocal Quality: Not observed Volitional Swallow: Unable to elicit    Oral/Motor/Sensory Function     Ice Chips Ice chips: Impaired Presentation: Spoon Oral Phase Impairments: Poor awareness of bolus Oral Phase Functional Implications: Prolonged oral transit   Thin Liquid Thin Liquid: Not tested    Nectar Thick Nectar Thick Liquid: Not tested   Honey Thick Honey Thick Liquid: Not tested   Puree Puree: Not tested   Solid   GO   Solid: Not tested        Maxcine Ham 10/20/2016,10:45  AM  Maxcine Ham, M.A. CCC-SLP [phone]

## 2016-10-20 NOTE — Care Management Note (Signed)
Case Management Note  Patient Details  Name: Manning CharityJuanita Y Carrera MRN: [id number] Date of Birth: 12-04-1932  Subjective/Objective:     Pt admitted on 06-30-17 s/p fall with SAH while on Plavix and Pletal.   PTA, pt resided at home with daughter.                 Action/Plan: Will follow for discharge planning as pt progresses.  Will need PT/OT evaluations upon medial stability.    Expected Discharge Date:                  Expected Discharge Plan:     In-House Referral:     Discharge planning Services  CM Consult  Post Acute Care Choice:    Choice offered to:     DME Arranged:    DME Agency:     HH Arranged:    HH Agency:     Status of Service:  In process, will continue to follow  If discussed at Long Length of Stay Meetings, dates discussed:    Additional Comments:  Quintella BatonJulie W. Finlee Concepcion, RN, BSN  Trauma/Neuro ICU Case Manager [phone]

## 2016-10-20 NOTE — ED Notes (Addendum)
Pt now non verbal. Pt able to follow commands. Pt appears to be attempting to talk but can not talk.   When pt asked to tell staff

## 2016-10-20 NOTE — Consult Note (Signed)
Reason for Consult:traumatic intracranial hemorrhage Referring Physician: Idali, Lafever is an 83 y.o. female.  HPI: whom fell out of bed while at home where she lives with her daughter. Her daughter noticed a laceration and called ems. Mrs. Blizard was brought to the [facility] ED alert, following commands, moving all extremities. She is on dual antiplatelet therapy for hypercoagulability plavix, and pletal. During her time in the ED she became acutely aphasic. Repeat head CT showed increase in size of subarachnoid blood and the ICH.   Past Medical History:  Diagnosis Date  . Aortic valve stenosis, moderate 2014   Calcific stenosis with increased velocities in the mid to distal aorta consistent with the greater than 60% diameter reduction.  Marland Kitchen CAD (coronary artery disease) of artery bypass graft February 2004    Occluded radial-OM graft; DES PCI proximal and mid Circumflex (mid: 2.5 mm 18 mm Cypher DES, proximal 2.5 mm x 13 mm Cypher DES)  . CAD, multiple vessel Progression until 2000   Referred for CABG x3 by Dr. Prescott Gum  . Carotid artery disease -left Most recent Dopplers July 2014   History of left carotid angioplasty (Dr. Scot Dock); carotid Dopplers July 2014: Bilateral ICAs less than 49% reduction. Patent left common carotid-subclavian artery bypass  . Chronic back pain   . Gastritis   . Gastroparesis   . H/O echocardiogram July 2014   EF 50-55%; mild LVH. Grade 1 diastolic dysfunction. Moderate HJ of inferolateral wall. Mild AI. Mild MR.   . H/O unstable angina February 2004   Diagnostic cath showed severe circumflex disease with occluded radial graft to OM  . History of nuclear stress test  September 2012   LexiScan Myoview: Large size, moderate-severe intensity defect in the basal inferoseptal, basal inferior walls as well as basal inferolateral mid inferolateral/apical lateral walls with mild reversibility. Thought to be mild hypokinesis in this distribution but  inconsistent with size of defect. Would suggest viable myocardium  . Hyperlipidemia LDL goal <70   . Hypertension     partially renovascular  . Occlusive mesenteric ischemia ([place])  2007; Doppler July 2014   BMS - SMA; Dr. Kathlene Cote (VIR); followup Dopplers July 2014: SMA and celiac arteries flow velocities consistent with greater than 60% diameter reduction  . Osteoarthritis of back     and Knees  . PAD (peripheral artery disease) ([place]) Most recent Doppler July 2014    extensive bilateral disease; R>L; Multiple bypass operations: AortoBifem (1998), R Fem-Pop; R Fem-tib; Fem-Fem -- occluded; 03/17/2013  Dopplerrs: R. ABI 0; left ABI 0.06. Right CFA, SFA-, femoropopliteal, fem-tib occluded; R. PT, AT-DP occluded.  LEFT distal EIA high resistance, CFA<50%,  peroneal not visualized -  progression of L. ABI (followup 6 months)   . Peptic ulcer disease   . Posterior MI ([place]) 1976   While on OCPs; age 89  . Right renal artery stenosis ([place]) 2004; most recent Doppler July 2014   PCI: Genesis 6.0 mm x 12 mm; abdominal vessel Dopplers July 2014: Right proximal renal artery at stent equal to or less than 60%; left proximal renal artery less than 50%. Right and left kidneys are equal size and symmetric. Left lower kidney pole with a 0.7 cm x 2.9 centimeter cyst  . S/P CABG x 3 2000   LIMA-LAD, SVG-RPDA, LRad-OM (known to be occluded)  . S/P cardiac catheterization  May 2010   Patent pedicled LIMA-LAD, patent SVG-PDA, patent circumflex stents. Also patent left carotid-left subclavian bypass;; apical and  distal inferior posterior apical hypokinesis  . Small bowel obstruction due to adhesions October 2004   Status post LOA  . Subclavian artery stenosis, left (HCC) Most recent Dopplers 2014    100% occlusion; status post left carotid-subclavian bypass (no evidence of steal phenomenon by cath in 2010, with adequate LIMA flow)    Past Surgical History:  Procedure Laterality Date  . Aortobifem  1998   Dr.  Bradly Bienenstock  . CARDIAC CATHETERIZATION  2010   Patent LIMA-LAD with good distal flow to diagonals; patent SVG-RPDA, patent circumflex stents. Patent left carotid-subclavian bypass with patent LIMA-LAD  . CAROTID ARTERY - SUBCLAVIAN ARTERY BYPASS GRAFT Left    Dr. Scot Dock  . CAROTID ENDARTERECTOMY Left    Dr. Donnetta Hutching  . COLON SURGERY  2004   r/t blockage  . CORONARY ARTERY BYPASS GRAFT  2000   LIMA-LAD, SVG-RPDA, left radial-OM  . CORONARY STENT PLACEMENT  2004   (for occluded radial-OM graft) Proximal circumflex 2.5 mm x 13 mm Cypher DES; mid circumflex 2.5 mm x 18 mm Cypher DES  . FEMORAL-TIBIAL BYPASS GRAFT Left   . LAPAROSCOPIC LYSIS OF ADHESIONS  October 2004  . PR VEIN BYPASS GRAFT,AORTO-FEM-POP  1998   right leg  . RENAL ARTERY STENT Right 2004   Dr. Rollene Fare 6 mm x 12 mm Genesis  . SUPERIOR MESTENTERIC ARTERY STENT  2007   Dr. Kathlene Cote    Family History  Problem Relation Age of Onset  . Diabetes Mother   . Heart disease Father   . Heart disease Sister   . Heart disease Brother   . Heart attack Brother     Social History:  reports that she quit smoking about 30 years ago. Her smoking use included Cigarettes. She has a 20.00 pack-year smoking history. She has never used smokeless tobacco. She reports that she does not drink alcohol or use drugs.  Allergies:  Allergies  Allergen Reactions  . Oxycodone Other (See Comments)    Causes Hallucinations makes her go crazy.  Patient seems to think that she can take med very short term.   . Contrast Media [Iodinated Diagnostic Agents] Hives, Itching and Rash    Benadryl works to counteract reaction  . Iohexol Hives, Itching and Rash    01-11-06---pt had erythema & mild itching with 13 hr prep, Onset Date: [id number]   . Promethazine Hives, Itching and Rash  . Vicodin [Hydrocodone-Acetaminophen] Hives, Itching and Rash    Medications: I have reviewed the patient's current medications.  Results for orders placed or performed  during the hospital encounter of 10/15/2016 (from the past 48 hour(s))  CBC with Differential/Platelet     Status: Abnormal   Collection Time: 10/20/16 12:37 AM  Result Value Ref Range   WBC 4.1 4.0 - 10.5 K/uL   RBC 4.14 3.87 - 5.11 MIL/uL   Hemoglobin 12.1 12.0 - 15.0 g/dL   HCT 38.2 36.0 - 46.0 %   MCV 92.3 78.0 - 100.0 fL   MCH 29.2 26.0 - 34.0 pg   MCHC 31.7 30.0 - 36.0 g/dL   RDW 13.4 11.5 - 15.5 %   Platelets 129 (L) 150 - 400 K/uL   Neutrophils Relative % 68 %   Neutro Abs 2.8 1.7 - 7.7 K/uL   Lymphocytes Relative 16 %   Lymphs Abs 0.7 0.7 - 4.0 K/uL   Monocytes Relative 13 %   Monocytes Absolute 0.6 0.1 - 1.0 K/uL   Eosinophils Relative 3 %   Eosinophils Absolute 0.1  0.0 - 0.7 K/uL   Basophils Relative 0 %   Basophils Absolute 0.0 0.0 - 0.1 K/uL  Basic metabolic panel     Status: Abnormal   Collection Time: 10/20/16 12:37 AM  Result Value Ref Range   Sodium 140 135 - 145 mmol/L   Potassium 4.0 3.5 - 5.1 mmol/L   Chloride 102 101 - 111 mmol/L   CO2 28 22 - 32 mmol/L   Glucose, Bld 115 (H) 65 - 99 mg/dL   BUN 17 6 - 20 mg/dL   Creatinine, Ser 1.09 (H) 0.44 - 1.00 mg/dL   Calcium 9.5 8.9 - 10.3 mg/dL   GFR calc non Af Amer 46 (L) >60 mL/min   GFR calc Af Amer 54 (L) >60 mL/min    Comment: (NOTE) The eGFR has been calculated using the CKD EPI equation. This calculation has not been validated in all clinical situations. eGFR's persistently <60 mL/min signify possible Chronic Kidney Disease.    Anion gap 10 5 - 15  Protime-INR     Status: None   Collection Time: 10/20/16 12:37 AM  Result Value Ref Range   Prothrombin Time 13.5 11.4 - 15.2 seconds   INR 1.03   Prepare Pheresed Platelets     Status: None (Preliminary result)   Collection Time: 10/20/16  3:55 AM  Result Value Ref Range   Unit Number [id number]    Blood Component Type PLTP LR1 PAS    Unit division 00    Status of Unit ISSUED    Transfusion Status OK TO TRANSFUSE     Dg Lumbar Spine  Complete  Result Date: 10/20/2016 CLINICAL DATA:  Fall with low back pain EXAM: LUMBAR SPINE - COMPLETE 4+ VIEW COMPARISON:  CT 04/10/2012 FINDINGS: Surgical clips in the right upper quadrant. SI joints are symmetric. Surgical clips in the retroperitoneum and groins. Vascular stents overlie L1 and L2. Lumbar alignment within normal limits. Vertebral body are grossly maintained. Moderate severe narrowing at L4-L5 and L5-S1 with endplate changes and osteophyte. IMPRESSION: 1. Degenerative changes.  No definite acute osseous abnormality. 2. Atherosclerotic vascular calcifications. Electronically Signed   By: Donavan Foil M.D.   On: 10/20/2016 00:20   Ct Head Wo Contrast  Result Date: 10/20/2016 CLINICAL DATA:  Subarachnoid hemorrhage EXAM: CT HEAD WITHOUT CONTRAST TECHNIQUE: Contiguous axial images were obtained from the base of the skull through the vertex without intravenous contrast. COMPARISON:  Head CT 10/20/2016 at 12:01 a.m. FINDINGS: Brain: Left frontal subarachnoid hemorrhage has increased from the prior examination. The area of hemorrhage now measures up to 4.5 cm in AP dimension. There may be an associated parenchymal component. There is increased mass effect on the frontal horn of left lateral ventricle. There is increase rightward bulging of the cerebral falx. No midline shift. No hydrocephalus. Vascular: No hyperdense vessel or unexpected calcification. Skull: Laceration and subgaleal hematoma of the left parietal scalp is again seen. No focal calvarial abnormality. Sinuses/Orbits: Paranasal sinuses and mastoids are free of fluid. Normal orbits. Other: None. IMPRESSION: 1. Increasing size of subarachnoid hematoma overlying the left frontal lobe. An associated intraparenchymal component would be difficult to exclude. 2. Increased mass effect on the frontal horn of the left lateral ventricle, but no hydrocephalus or midline shift. These results will be called to the ordering clinician or  representative by the Radiologist Assistant, and communication documented in the PACS or zVision Dashboard. Electronically Signed   By: Ulyses Jarred M.D.   On: 10/20/2016 03:50   Ct Head Wo  Contrast  Result Date: 10/20/2016 CLINICAL DATA:  Status post fall. Hit back of head on floor. Patient on blood thinners. Initial encounter. EXAM: CT HEAD WITHOUT CONTRAST TECHNIQUE: Contiguous axial images were obtained from the base of the skull through the vertex without intravenous contrast. COMPARISON:  CT of the head performed 08/14/2009, and MRI of the brain from 08/16/2009 FINDINGS: Brain: Focal acute subarachnoid hemorrhage is noted along the medial left frontal lobe, with trace blood tracking along the left anterior falx cerebri. No evidence of acute infarction, hydrocephalus, extra-axial collection or mass lesion. Prominence of the ventricles and sulci reflects mild cortical volume loss. Mild cerebellar atrophy is noted. Scattered periventricular and subcortical white matter change likely reflects small vessel ischemic microangiopathy. The brainstem and fourth ventricle are within normal limits. The basal ganglia are unremarkable in appearance. No midline shift is seen. Vascular: No hyperdense vessel or unexpected calcification. Skull: There is no evidence of fracture; visualized osseous structures are unremarkable in appearance. Sinuses/Orbits: The orbits are within normal limits. The paranasal sinuses and mastoid air cells are well-aerated. Other: Soft tissue swelling is noted at the posterior vertex. IMPRESSION: 1. Focal acute subarachnoid hemorrhage along the medial left frontal lobe, with trace blood tracking along the left anterior falx cerebri. 2. Soft tissue swelling at the posterior vertex. 3. Mild cortical volume loss and scattered small vessel ischemic microangiopathy. Critical Value/emergent results were called by telephone at the time of interpretation on 10/20/2016 at 12:34 am to Dr. Joseph Berkshire, who verbally acknowledged these results. Electronically Signed   By: Garald Balding M.D.   On: 10/20/2016 00:35    Review of Systems  Unable to perform ROS: Mental status change   Blood pressure 172/59, pulse 96, temperature 99.3 F (37.4 C), temperature source Oral, resp. rate 25, SpO2 95 %. Physical Exam  Constitutional: She appears well-developed and well-nourished.  HENT:  Head: Normocephalic.  Laceration occipital region  Eyes: Pupils are equal, round, and reactive to light.  Cardiovascular: Normal rate and regular rhythm.   Respiratory: Effort normal and breath sounds normal.  GI: Soft.  Neurological: She is alert.  Patient is currently mute, does not follow commands Perrl, conjugate eye movement Symmetric facies Right gaze preference Cannot perform sensory or motor exam secondary to inability to follow commands Has at the very least 3/5 strength in all extremities Gait not assessed     Assessment/Plan: Mrs. Ricco presented with post traumatic subarachnoid blood in the medial left frontal lobe. The clot has grown in size, but is not causing any shift, nor any compromise of the basal cisterns. No evidence at all of intracranial herniation. One must suspect a possible seizure due to the location of the blood along the falx, and medial frontal lobe. The aphasia might be due to direct damage. At this time she is not an operative candidate, as the intracranial volume and cerebral atrophy should allow a clot of this size to be present. Her symptoms in no way influence the decision at this time to observe the [facility] as the risk of surgery is quite high give her anticoagulation, and CAD. I have explained my rationale and decisions to her daughter. Mrs. Short will be admitted to the Neurosurgery ICU for close observation.  She has received Keppra, platelets, and DDAVP.    Verdine Grenfell L 10/20/2016, 5:24 AM

## 2016-10-21 ENCOUNTER — Inpatient Hospital Stay (HOSPITAL_COMMUNITY): Payer: Medicare Other

## 2016-10-21 DIAGNOSIS — N390 Urinary tract infection, site not specified: Secondary | ICD-10-CM | POA: Insufficient documentation

## 2016-10-21 DIAGNOSIS — L8990 Pressure ulcer of unspecified site, unspecified stage: Secondary | ICD-10-CM | POA: Insufficient documentation

## 2016-10-21 LAB — BASIC METABOLIC PANEL
Anion gap: 8 (ref 5–15)
BUN: 14 mg/dL (ref 6–20)
CHLORIDE: 106 mmol/L (ref 101–111)
CO2: 24 mmol/L (ref 22–32)
CREATININE: 0.78 mg/dL (ref 0.44–1.00)
Calcium: 9.1 mg/dL (ref 8.9–10.3)
GFR calc non Af Amer: >60 mL/min (ref >60)
Glucose, Bld: 157 mg/dL — ABNORMAL HIGH (ref 65–99)
Potassium: 3.7 mmol/L (ref 3.5–5.1)
Sodium: 138 mmol/L (ref 135–145)

## 2016-10-21 LAB — MAGNESIUM
Magnesium: 1.5 mg/dL — ABNORMAL LOW (ref 1.7–2.4)
Magnesium: 1.5 mg/dL — ABNORMAL LOW (ref 1.7–2.4)
Magnesium: 2.3 mg/dL (ref 1.7–2.4)

## 2016-10-21 LAB — PREPARE PLATELET PHERESIS
BLOOD PRODUCT EXPIRATION DATE: 201802232359
ISSUE DATE / TIME: 201802220416
Unit Type and Rh: 6200

## 2016-10-21 LAB — CBC
HEMATOCRIT: 34.8 % — AB (ref 36.0–46.0)
HEMOGLOBIN: 11.2 g/dL — AB (ref 12.0–15.0)
MCH: 29.1 pg (ref 26.0–34.0)
MCHC: 32.2 g/dL (ref 30.0–36.0)
MCV: 90.4 fL (ref 78.0–100.0)
Platelets: 127 10*3/uL — ABNORMAL LOW (ref 150–400)
RBC: 3.85 MIL/uL — ABNORMAL LOW (ref 3.87–5.11)
RDW: 13.4 % (ref 11.5–15.5)
WBC: 9.2 10*3/uL (ref 4.0–10.5)

## 2016-10-21 LAB — GLUCOSE, CAPILLARY
GLUCOSE-CAPILLARY: 128 mg/dL — AB (ref 65–99)
GLUCOSE-CAPILLARY: 196 mg/dL — AB (ref 65–99)
Glucose-Capillary: 117 mg/dL — ABNORMAL HIGH (ref 65–99)
Glucose-Capillary: 154 mg/dL — ABNORMAL HIGH (ref 65–99)

## 2016-10-21 LAB — PHOSPHORUS
PHOSPHORUS: 2.5 mg/dL (ref 2.5–4.6)
Phosphorus: 2.1 mg/dL — ABNORMAL LOW (ref 2.5–4.6)
Phosphorus: 1.7 mg/dL — ABNORMAL LOW (ref 2.5–4.6)

## 2016-10-21 MED ORDER — JEVITY 1.2 CAL PO LIQD
1000.0000 mL | ORAL | Status: DC
  Administered 2016-10-21: 1000 mL
  Filled 2016-10-21 (×7): qty 1000

## 2016-10-21 MED ORDER — INSULIN ASPART 100 UNIT/ML ~~LOC~~ SOLN
2.0000 [IU] | SUBCUTANEOUS | Status: DC
  Administered 2016-10-21 – 2016-10-22 (×3): 4 [IU] via SUBCUTANEOUS
  Administered 2016-10-22: 2 [IU] via SUBCUTANEOUS

## 2016-10-21 MED ORDER — MAGNESIUM SULFATE 2 GM/50ML IV SOLN
2.0000 g | Freq: Once | INTRAVENOUS | Status: AC
  Administered 2016-10-21: 2 g via INTRAVENOUS
  Filled 2016-10-21: qty 50

## 2016-10-21 MED ORDER — MORPHINE SULFATE (PF) 2 MG/ML IV SOLN
2.0000 mg | INTRAVENOUS | Status: DC | PRN
  Administered 2016-10-21 – 2016-10-22 (×4): 2 mg via INTRAVENOUS
  Filled 2016-10-21: qty 1
  Filled 2016-10-21: qty 2
  Filled 2016-10-21: qty 1
  Filled 2016-10-21: qty 2

## 2016-10-21 MED ORDER — SODIUM CHLORIDE 0.9 % IV SOLN
200.0000 ug/h | INTRAVENOUS | Status: DC
  Administered 2016-10-21: 25 ug/h via INTRAVENOUS
  Administered 2016-10-23: 75 ug/h via INTRAVENOUS
  Administered 2016-10-23: 105 ug/h via INTRAVENOUS
  Administered 2016-10-23 – 2016-10-24 (×2): 115 ug/h via INTRAVENOUS
  Administered 2016-10-25: 200 ug/h via INTRAVENOUS
  Administered 2016-10-25 – 2016-10-26 (×2): 210 ug/h via INTRAVENOUS
  Filled 2016-10-21 (×6): qty 50

## 2016-10-21 MED ORDER — POTASSIUM CHLORIDE 20 MEQ/15ML (10%) PO SOLN
20.0000 meq | ORAL | Status: AC
  Administered 2016-10-21 (×2): 20 meq
  Filled 2016-10-21 (×2): qty 15

## 2016-10-21 MED ORDER — LABETALOL HCL 5 MG/ML IV SOLN
10.0000 mg | INTRAVENOUS | Status: DC | PRN
  Administered 2016-10-21 – 2016-10-22 (×3): 10 mg via INTRAVENOUS
  Filled 2016-10-21 (×2): qty 4

## 2016-10-21 MED ORDER — OXYCODONE HCL 5 MG/5ML PO SOLN
5.0000 mg | ORAL | Status: DC | PRN
  Administered 2016-10-21: 5 mg via ORAL
  Filled 2016-10-21: qty 5

## 2016-10-21 MED ORDER — CHLORHEXIDINE GLUCONATE 0.12 % MT SOLN
OROMUCOSAL | Status: AC
  Filled 2016-10-21: qty 15

## 2016-10-21 MED ORDER — LABETALOL HCL 5 MG/ML IV SOLN
INTRAVENOUS | Status: AC
  Administered 2016-10-21: 10 mg via INTRAVENOUS
  Filled 2016-10-21: qty 4

## 2016-10-21 MED ORDER — PRO-STAT SUGAR FREE PO LIQD
30.0000 mL | Freq: Every day | ORAL | Status: DC
  Administered 2016-10-22 – 2016-10-24 (×3): 30 mL
  Filled 2016-10-21 (×3): qty 30

## 2016-10-21 MED ORDER — PREGABALIN 50 MG PO CAPS
100.0000 mg | ORAL_CAPSULE | Freq: Three times a day (TID) | ORAL | Status: DC
  Administered 2016-10-21 – 2016-10-22 (×4): 100 mg via ORAL
  Filled 2016-10-21 (×4): qty 2

## 2016-10-21 MED ORDER — ATORVASTATIN CALCIUM 20 MG PO TABS
20.0000 mg | ORAL_TABLET | Freq: Every morning | ORAL | Status: DC
  Administered 2016-10-21 – 2016-10-22 (×2): 20 mg via ORAL
  Filled 2016-10-21 (×2): qty 1

## 2016-10-21 MED ORDER — VITAL HIGH PROTEIN PO LIQD: 1000.0000 mL | ORAL | Status: DC

## 2016-10-21 MED ORDER — HYDRALAZINE HCL 20 MG/ML IJ SOLN
10.0000 mg | INTRAMUSCULAR | Status: DC | PRN
  Administered 2016-10-21 – 2016-10-25 (×8): 20 mg via INTRAVENOUS
  Filled 2016-10-21 (×8): qty 1

## 2016-10-21 MED ORDER — OXYCODONE HCL 5 MG/5ML PO SOLN: 5.0000 mg | ORAL | Status: DC | PRN

## 2016-10-21 MED ORDER — PRO-STAT SUGAR FREE PO LIQD: 30.0000 mL | Freq: Two times a day (BID) | ORAL | Status: DC

## 2016-10-21 NOTE — Progress Notes (Signed)
Patient ID: Betty Floyd, female   DOB: [birth year], 83 y.o.   MRN: [id number]  BP (!) 128/114   Pulse 100   Temp 99.5 F (37.5 C) (Axillary)   Resp (!) 26   Ht 5\' 2"  (1.575 m)   Wt 67.1 kg (147 lb 14.9 oz)   SpO2 90%   BMI 27.06 kg/m  Alert, mute Not following commands Moving her extremities Perrl,  Purposeful, tracks with eyes I understand she has spoken with the nurse Obviously improved With her not being near fluent, I do not believe the floor is appropriate just yet.

## 2016-10-21 NOTE — Progress Notes (Signed)
Return from Ct. VS unchanged.  BP 170's/80's and HR 110's and resps 48. Dr.Cabbell states spoke with family.  Family called to bedside

## 2016-10-21 NOTE — Progress Notes (Signed)
Spoke with Dr.Cabbell with pt changes in VS resps increasing and abdominal.  See VS flowsheet

## 2016-10-21 NOTE — Progress Notes (Signed)
Initial Nutrition Assessment  INTERVENTION:   Jevity 1.2 @ 50 ml/hr 30 ml Prostat daily Provides: 1540 kcal, 81 grams protein, and 972 ml free water.   NUTRITION DIAGNOSIS:   Inadequate oral intake related to inability to eat as evidenced by NPO status.  GOAL:   Patient will meet greater than or equal to 90% of their needs  MONITOR:   Diet advancement, TF tolerance, Labs  REASON FOR ASSESSMENT:   Consult Enteral/tube feeding initiation and management  ASSESSMENT:   83 y.o. female admitted 02/22 after mechanical fall with subsequent SAH.  Pt unable to answer any questions. Spoke with daughter and son. Per daughter, whom she lives with, the patient has not lost any weight recently. She has a good appetite and does not drink any supplements and does not really care for them.  Pt uses walker to get around but has hx of falls.  Nutrition-Focused physical exam completed. Findings are no fat depletion, no muscle depletion, and no edema.   Cortrak placement pending due to dysphagia.   Diet Order:  Diet NPO time specified  Skin:  Wound (see comment) (MASD buttocks, stage I R heel)  Last BM:  unknown  Height:   Ht Readings from Last 1 Encounters:  10/20/16 5\' 2"  (1.575 m)    Weight:   Wt Readings from Last 1 Encounters:  10/21/16 147 lb 14.9 oz (67.1 kg)    Ideal Body Weight:  50 kg  BMI:  Body mass index is 27.06 kg/m.  Estimated Nutritional Needs:   Kcal:  1500-1700  Protein:  75-87 grams  Fluid:  > 1.5 L/day  EDUCATION NEEDS:   No education needs identified at this time  Kendell BaneHeather Shaka Cardin RD, LDN, CNSC [phone] Pager [phone] After Hours Pager

## 2016-10-21 NOTE — Progress Notes (Signed)
Dr.Cabbell at bedside. Pt to Ct.  Family at bedside

## 2016-10-21 NOTE — Progress Notes (Signed)
PULMONARY / CRITICAL CARE MEDICINE   Name: Betty Floyd MRN: [id number] DOB: [birth year]    ADMISSION DATE:  10/07/2016 CONSULTATION DATE:    Cindi Carbon MD: Dr. Robb Matar   CHIEF COMPLAINT: Traumatic SAH  Brief: 83 y.o. female with PMH of Aortic Valve Stenosis, CAD (Home Plavix), HTN, Previous MI.   On 2/21 was trying to get back into bed when she suffered a mechanical fall causing her to strike the back of her head on the floor. In ED [facility] demonstrated SAH. Repeat CTH demonstrated increase in size of SAH. She was given platelet transfusion and neurosurgery was consulted.  She was given keppra for seizure prophylaxis as well as 2 units of platelets.  PCCM was called for admission.  SUBJECTIVE:  No distress. Neuro status improving slowly.   VITAL SIGNS: BP (!) 152/109   Pulse 98   Temp 99.5 F (37.5 C) (Oral)   Resp (!) 32   Ht 5\' 2"  (1.575 m)   Wt 67.1 kg (147 lb 14.9 oz)   SpO2 98%   BMI 27.06 kg/m   HEMODYNAMICS:    VENTILATOR SETTINGS:    INTAKE / OUTPUT: I/O last 3 completed shifts: In: 1861.7 [I.V.:1242.7; Blood:354; IV Piggyback:265] Out: 751 [Urine:750; Emesis/NG output:1]  PHYSICAL EXAMINATION: General: Elderly female, no distress, lying in bed  Neuro:  Alert, pupils intact, responds to physical stimulation, moves extremities, right side weak  HEENT: Normocephalic  Cardiovascular: RRR, no MRG, NI S1/S2 Lungs:  Clear breath sounds  Abdomen:  Non-tender, non-distended, active bowel sounds  Musculoskeletal:  No acute Skin:  Warm, dry, intact   LABS:  BMET  Recent Labs Lab 10/20/16 0037 10/21/16 0329  NA 140 138  K 4.0 3.7  CL 102 106  CO2 28 24  BUN 17 14  CREATININE 1.09* 0.78  GLUCOSE 115* 157*    Electrolytes  Recent Labs Lab 10/20/16 0037 10/21/16 0329  CALCIUM 9.5 9.1  MG  --  1.5*  PHOS  --  2.5    CBC  Recent Labs Lab 10/20/16 0037 10/21/16 0329  WBC 4.1 9.2  HGB 12.1 11.2*  HCT 38.2 34.8*  PLT 129* 127*     Coag's  Recent Labs Lab 10/20/16 0037  INR 1.03    Sepsis Markers No results for input(s): LATICACIDVEN, PROCALCITON, O2SATVEN in the last 168 hours.  ABG No results for input(s): PHART, PCO2ART, PO2ART in the last 168 hours.  Liver Enzymes No results for input(s): AST, ALT, ALKPHOS, BILITOT, ALBUMIN in the last 168 hours.  Cardiac Enzymes No results for input(s): TROPONINI, PROBNP in the last 168 hours.  Glucose  Recent Labs Lab 10/20/16 1241 10/20/16 1842  GLUCAP 95 112*    Imaging No results found.   STUDIES:  CTH 02/22 > focal acute SAH along medial left frontal lobe. Soft tissue swelling at posterior vertex. CTH 02/22 > increase in size of SAH over left frontal lobe with increase mass effect.  No hydrocephalus or MLS.  CULTURES: None.  ANTIBIOTICS: Rocephin 2/22 > 2/23  SIGNIFICANT EVENTS: 02/22 > admit.  LINES/TUBES: None.  DISCUSSION: 83 y.o. female admitted 02/22 after mechanical fall with subsequent SAH.  While in ED, had change in mental status and CTH demonstrated worsening SAH.  Seen by neurosurgery and pt deemed not a surgical candidate. PCCM asked to admit to ICU for frequent neuro checks.  ASSESSMENT / PLAN:  PULMONARY A: No issues  P:   Maintain Oxygen > 92 Pulmonary Hygiene > IS, Mobilize   CARDIOVASCULAR  A:  H/O Left Subclavian Stenosis, CAD, MI s/p CABG, PAD, HTN, HLD, angina, aortic  P:  Cardiac Monitoring  Labetalol PRN and hydralazine PRN, goal <160 Hold home ASA, cilostazol, imdur, losartan, lopressor   RENAL A:   Acute Kidney Injury - Improved  Hypokalemia  P:   NS @ 50 ml/hr Trend BMP Replace electrolytes as needed  GASTROINTESTINAL A:   Dysphagia  P:   Place Cortrack  TF Continue with Speech Therapy   HEMATOLOGIC A:   No issues  P:  Hold anticoagulation  Trend CBC  INFECTIOUS A:   UTI ?  -Urine Culture PCP negative  P:   D/C Rocephin  Trend Fever and WBC curve  ENDOCRINE A:    Hyperglycemia    P:   SSI Q4H Glucose Checks   NEUROLOGIC A:   Traumatic SAH -On home plavix, pletal  Hx Chronic Back Pain  P:   Per Neurosurgery  PRN fentanyl  Continue Keppra  RASS goal: 0    FAMILY  - Updates: Family updated at bedside 2/23  - Inter-disciplinary family meet or Palliative Care meeting due by:  Ongoing    Jovita KussmaulKatalina Rohin Krejci, AG-ACNP [facility]  Pgr: [phone]  PCCM Pgr: [phone]

## 2016-10-21 NOTE — Plan of Care (Signed)
Problem: Nutrition: Goal: Adequate nutrition will be maintained Outcome: Completed/Met Date Met: 10/21/16 Started tube feeds 10-21-16

## 2016-10-21 NOTE — Progress Notes (Signed)
Called to bedside for increasing respiratory distress with respiratory rate in the 40s and shallow breaths. Patient in mild distress, largely non-communicative. Lungs largely clear on anterior exam.   Discussion with family - son and daughter - their focus is on her comfort. Her wishes were clear that she would never want to be intubated and they feel that BPAP would also be contrary to her wishes. They feel that she is uncomfortable and that pain is contributing and would like additional pain medication given. I explained that pain medications may affect her ability to breathe adequately, which may hasten respiratory failure and possibly death. They understand. They do not wish to transition to full comfort measures at this time, but understand that that will be the next step if things do not improve.   Chaplain offered.   All questions answered and concerns addressed.   Plan: Fentanyl gtt with 2-4mg  morphine q2h prn.  Hold tube feeds for now.   Nita SickleSarah Ellen E. Stephens, MD Pulmonary and Critical Care 10/21/16 8:36 PM

## 2016-10-21 NOTE — Progress Notes (Signed)
  Speech Language Pathology Treatment: Dysphagia  Patient Details Name: Betty Floyd MRN: [id number] DOB: 1933/07/02 Today's Date: 10/21/2016 Time: [id number] SLP Time Calculation (min) (ACUTE ONLY): 20 min  Assessment / Plan / Recommendation Clinical Impression  Pt is more alert today but still mostly keeps her lips closed with attempts at oral care and PO trials. She will intermittently lick her lips when ice chips are provided, but does not open her mouth to accept the bolus. With applesauce applied to her lower lip, she will stick her tongue out to take in the bolus, taking an additional amount off the spoon x2. When she does take in small amounts of applesauce, it does elicit a swallow. Across minimal observation (<1/2 tsp applesauce), still suspect her dysphagia is largely cognitively/orally based, although it is difficult to assess pharyngeal function across such a small amount of trials. Recommend to continue NPO for now with additional trials with SLP only.   HPI HPI: 83 yo female admitted s/p fall with SAH in the left frontal lobe. Pt developed aphasia in ED with CT showing increased size of hematoma. PMH includes CABG x3, MI, PAD, HTN, gastroparesis, CAD      SLP Plan  Continue with current plan of care       Recommendations  Diet recommendations: NPO Medication Administration: Via alternative means                Oral Care Recommendations: Oral care QID Follow up Recommendations:  (tba) SLP Visit Diagnosis: Dysphagia, unspecified (R13.10) Plan: Continue with current plan of care       GO                Betty Floyd, Betty Floyd 10/21/2016, 10:17 AM  Betty Floyd, M.A. CCC-SLP [phone]

## 2016-10-21 NOTE — Progress Notes (Addendum)
Per Dr Jane Canaryamaswamy's request, called patient[facility] results of patient's urine culture. Left message with Dr Alver FisherShaw's assistant, Tresa EndoKelly, with request and asked for a return call. Will update Dr. Marchelle Gearingamaswamy once received. Chanan Detwiler C 11:45 AM  Tresa EndoKelly from Dr. Alver FisherShaw's office returned call.  Will fax results asap. Kashayla Ungerer C  Results were received by fax.  Shown to Dr. Marchelle Gearingamaswamy and put in shadow chart Michela Herst C 12:16 PM

## 2016-10-21 NOTE — Progress Notes (Signed)
Patient ID: Betty Floyd, female   DOB: 1933/05/31, 83 y.o.   MRN: [id number] BP (!) 171/86   Pulse (!) 103   Temp 99 F (37.2 C)   Resp (!) 30   Ht 5\' 2"  (1.575 m)   Wt 67.1 kg (147 lb 14.9 oz)   SpO2 95%   BMI 27.06 kg/m  Breathing had become shallow, and fairly rapid Head ct today revealed increased clot size, still no herniation basal cisterns widely patent.  I have spoken with her daughter. Under no circumstance will she have her mother intubated.

## 2016-10-21 NOTE — Progress Notes (Signed)
STAFF NOTE: I, Dr Lavinia SharpsM Alyza Artiaga have personally reviewed patient's available data, including medical history, events of note, physical examination and test results as part of my evaluation. I have discussed with resident/NP and other care providers such as pharmacist, RN and RRT.  In addition,  I personally evaluated patient and elicited key findings of   S: daughter and son at bedside. More awake but right side still weak. Usinf left side. No fever  O: frail Awake Uuses left Rigth side still weak Does not track   Recent Labs Lab 10/20/16 0037 10/21/16 0329  HGB 12.1 11.2*  HCT 38.2 34.8*  WBC 4.1 9.2  PLT 129* 127*    Recent Labs Lab 10/20/16 0037 10/21/16 0329  NA 140 138  K 4.0 3.7  CL 102 106  CO2 28 24  GLUCOSE 115* 157*  BUN 17 14  CREATININE 1.09* 0.78  CALCIUM 9.5 9.1  MG  --  1.5*  PHOS  --  2.5     A: Low mag UTI SAH with Rt side weakness   P:  repplete mag IV ceftriaxone - try to get urine culture report from PCP Martha ClanShaw, William, MD - RN will call [facility] following for Rt weakness - > dispo per Dr Franky Machoabbell who is in OR right now    .  Rest per NP/medical resident whose note is outlined above and that I agree with     Dr. Kalman ShanMurali Cyrah Mclamb, M.D., [facility]F.C.C.P Pulmonary and Critical Care Medicine Staff Physician  System [place] [facility] Pager: [phone], If no answer or between  15:00h - 7:00h: call [phone]  10/21/2016 11:25 AM

## 2016-10-22 DIAGNOSIS — Z66 Do not resuscitate: Secondary | ICD-10-CM

## 2016-10-22 DIAGNOSIS — Z515 Encounter for palliative care: Secondary | ICD-10-CM

## 2016-10-22 DIAGNOSIS — I2510 Atherosclerotic heart disease of native coronary artery without angina pectoris: Secondary | ICD-10-CM

## 2016-10-22 LAB — GLUCOSE, CAPILLARY
GLUCOSE-CAPILLARY: 121 mg/dL — AB (ref 65–99)
GLUCOSE-CAPILLARY: 115 mg/dL — AB (ref 65–99)
GLUCOSE-CAPILLARY: 131 mg/dL — AB (ref 65–99)
Glucose-Capillary: 152 mg/dL — ABNORMAL HIGH (ref 65–99)

## 2016-10-22 LAB — CBC
HCT: 36.2 % (ref 36.0–46.0)
HEMOGLOBIN: 11.6 g/dL — AB (ref 12.0–15.0)
MCH: 29.4 pg (ref 26.0–34.0)
MCHC: 32 g/dL (ref 30.0–36.0)
MCV: 91.9 fL (ref 78.0–100.0)
Platelets: 157 10*3/uL (ref 150–400)
RBC: 3.94 MIL/uL (ref 3.87–5.11)
RDW: 13.6 % (ref 11.5–15.5)
WBC: 12.4 10*3/uL — ABNORMAL HIGH (ref 4.0–10.5)

## 2016-10-22 LAB — BASIC METABOLIC PANEL
ANION GAP: 10 (ref 5–15)
BUN: 20 mg/dL (ref 6–20)
CALCIUM: 9.4 mg/dL (ref 8.9–10.3)
CO2: 25 mmol/L (ref 22–32)
Chloride: 104 mmol/L (ref 101–111)
Creatinine, Ser: 0.81 mg/dL (ref 0.44–1.00)
GFR calc Af Amer: >60 mL/min (ref >60)
GFR calc non Af Amer: >60 mL/min (ref >60)
GLUCOSE: 118 mg/dL — AB (ref 65–99)
Potassium: 4.2 mmol/L (ref 3.5–5.1)
Sodium: 139 mmol/L (ref 135–145)

## 2016-10-22 LAB — MAGNESIUM
MAGNESIUM: 2 mg/dL (ref 1.7–2.4)
Magnesium: 1.9 mg/dL (ref 1.7–2.4)

## 2016-10-22 LAB — PHOSPHORUS
Phosphorus: 2.3 mg/dL — ABNORMAL LOW (ref 2.5–4.6)
Phosphorus: 2.1 mg/dL — ABNORMAL LOW (ref 2.5–4.6)

## 2016-10-22 MED ORDER — ATROPINE SULFATE 1 % OP SOLN
2.0000 [drp] | Freq: Four times a day (QID) | OPHTHALMIC | Status: DC
  Administered 2016-10-22 – 2016-10-26 (×11): 2 [drp] via SUBLINGUAL
  Filled 2016-10-22 (×2): qty 5

## 2016-10-22 MED ORDER — PREGABALIN 50 MG PO CAPS
100.0000 mg | ORAL_CAPSULE | Freq: Three times a day (TID) | ORAL | Status: DC
  Administered 2016-10-23 – 2016-10-24 (×4): 100 mg
  Filled 2016-10-22 (×4): qty 2

## 2016-10-22 MED ORDER — ATORVASTATIN CALCIUM 20 MG PO TABS: 20.0000 mg | ORAL_TABLET | Freq: Every morning | ORAL | Status: DC

## 2016-10-22 NOTE — Assessment & Plan Note (Signed)
DNR

## 2016-10-22 NOTE — Progress Notes (Signed)
eLink Physician-Brief Progress Note Patient Name: Betty Floyd DOB: [birth year] MRN: [id number]   Date of Service  10/22/2016  HPI/Events of Note  Multiple issues: 1. Request for Foley catheter and 2. Oral secretions.   eICU Interventions  Will order: 1. Insert Foley Catheter.  2. Atropine drops 2 drops SL Q 6 hours.      Intervention Category Minor Interventions: Routine modifications to care plan (e.g. PRN medications for pain, fever)  Betty Floyd 10/22/2016, 9:28 PM

## 2016-10-22 NOTE — Progress Notes (Signed)
eLink Physician-Brief Progress Note Patient Name: Betty Floyd DOB: Aug 06, 1933 MRN: [id number]   Date of Service  10/22/2016  HPI/Events of Note  Patient can go to a palliative care bed per Dr. Jane Canaryamaswamy's not. Nurse requests transfer to palliative care bed.   eICU Interventions  Will transfer to palliative care bed.      Intervention Category Minor Interventions: Routine modifications to care plan (e.g. PRN medications for pain, fever)  Betty Floyd 10/22/2016, 6:30 PM

## 2016-10-22 NOTE — Assessment & Plan Note (Signed)
Prognosis is few to several days. Tolerating prn opioid . Doctrine of double effect explained  Plan Move to palliative floor Prn morphine and ativan IF worse transition to gtt

## 2016-10-22 NOTE — Evaluation (Signed)
Physical Therapy Evaluation Patient Details Name: Betty Floyd MRN: [id number] DOB: 02/23/1933 Today's Date: 10/22/2016   History of Present Illness  83 yo female admitted s/p fall with SAH in the left frontal lobe. Pt developed aphasia in ED with CT showing increased size of hematoma. PMH includes CABG x3, MI, PAD, HTN, gastroparesis, CAD  Clinical Impression  Patient demonstrates deficits in functional mobility as indicated below. Will need continued skilled PT to address deficits and maximize function. Will see as indicated and progress as tolerated.   OF NOTE: VSS throughout session on 3 liter O2. Patient with increased arousal at EOB (eyes open, visual tracking) but not following commands with any consistency (only follow 3 commands with left side throughout session, no commands with right)     Follow Up Recommendations SNF;Supervision/Assistance - 24 hour    Equipment Recommendations  Other (comment) (TBD)    Recommendations for Other Services       Precautions / Restrictions Precautions Precautions: Fall Restrictions Weight Bearing Restrictions: No      Mobility  Bed Mobility Overal bed mobility: Needs Assistance Bed Mobility: Rolling;Supine to Sit;Sit to Supine Rolling: Total assist;+2 for physical assistance   Supine to sit: Total assist;+2 for physical assistance Sit to supine: Total assist;+2 for physical assistance   General bed mobility comments: Total assist for helicopter technique to EOB  Transfers                    Ambulation/Gait                Stairs            Wheelchair Mobility    Modified Rankin (Stroke Patients Only)       Balance Overall balance assessment: Needs assistance Sitting-balance support: Bilateral upper extremity supported;Feet supported Sitting balance-Leahy Scale: Poor Sitting balance - Comments: pt able to maintain static sitting without physical assist once directed to midline (min  guard) Postural control: Right lateral lean                                   Pertinent Vitals/Pain Pain Assessment: Faces Faces Pain Scale: No hurt    Home Living Family/patient expects to be discharged to:: Unsure Living Arrangements: Children                    Prior Function                 Hand Dominance        Extremity/Trunk Assessment   Upper Extremity Assessment Upper Extremity Assessment: Defer to OT evaluation    Lower Extremity Assessment Lower Extremity Assessment: Difficult to assess due to impaired cognition (AROM noted left side, increased tone with PROM)       Communication      Cognition Arousal/Alertness: Lethargic (arouses to upright positioning) Behavior During Therapy: Flat affect Overall Cognitive Status: Difficult to assess Area of Impairment: Following commands       Following Commands: Follows one step commands inconsistently       General Comments: patient with improved arousal upon movement to EOB, was able to track visually, and ocassionally follow simngle step command with left side. No ability noted on right side    General Comments      Exercises     Assessment/Plan    PT Assessment Patient needs continued PT services  PT Problem List Decreased strength;Decreased range of  motion;Decreased activity tolerance;Decreased balance;Decreased mobility;Decreased coordination;Decreased cognition;Impaired tone       PT Treatment Interventions DME instruction;Gait training;Functional mobility training;Therapeutic activities;Therapeutic exercise;Balance training;Neuromuscular re-education;Cognitive remediation;Patient/family education;Wheelchair mobility training    PT Goals (Current goals can be found in the Care Plan section)  Acute Rehab PT Goals Patient Stated Goal: none stated PT Goal Formulation: Patient unable to participate in goal setting Time For Goal Achievement: 11/05/16 Potential to Achieve  Goals: Fair    Frequency Min 3X/week   Barriers to discharge        Co-evaluation               End of Session Equipment Utilized During Treatment: Oxygen Activity Tolerance: Patient limited by fatigue;Patient limited by lethargy Patient left: in bed;with call bell/phone within reach;with bed alarm set Nurse Communication: Mobility status PT Visit Diagnosis: Other symptoms and signs involving the nervous system (R29.898);Hemiplegia and hemiparesis Hemiplegia - Right/Left: Right Hemiplegia - dominant/non-dominant: Dominant Hemiplegia - caused by: Cerebral infarction         Time: [id number] PT Time Calculation (min) (ACUTE ONLY): 20 min   Charges:   PT Evaluation $PT Eval Moderate Complexity: 1 Procedure     PT G Codes:         Fabio AsaDevon J Rigo Letts 10/22/2016, 12:00 PM Charlotte Crumbevon Raylin Diguglielmo, PT DPT  [phone]

## 2016-10-22 NOTE — Assessment & Plan Note (Signed)
Nil acute

## 2016-10-22 NOTE — Assessment & Plan Note (Signed)
[facility] . Worse 10/21/16 on CT. On 10/22/16 continue coma and needing morphine prn for headache and distress. D/w Dr Bevely Palmeritty of NSGY - no plans to operate due to comorbidities and not c/w goals of care of patient family. Daughtger and son confirmed same to me  Plan Supportive care

## 2016-10-22 NOTE — Progress Notes (Signed)
  Speech Language Pathology Treatment: Dysphagia  Patient Details Name: Betty Floyd MRN: [id number] DOB: 07-Nov-1932 Today's Date: 10/22/2016 Time: 1215-1228 SLP Time Calculation (min) (ACUTE ONLY): 13 min  Assessment / Plan / Recommendation Clinical Impression  Skilled observation with intake of ice chips with anterior loss, poor awareness of bolus and limited oral manipulation noted after extensive oral care provided with nursing (in attempt to remove dentures for cleaning); pt kept eyes closed and was lethargic throughout session; recommend continue PO trials and NPO status; ST will continue to f/u while in acute setting.   HPI HPI: 83 yo female admitted s/p fall with SAH in the left frontal lobe. Pt developed aphasia in ED with CT showing increased size of hematoma. PMH includes CABG x3, MI, PAD, HTN, gastroparesis, CAD      SLP Plan  Continue with current plan of care       Recommendations  Diet recommendations: NPO Medication Administration: Via alternative means                Oral Care Recommendations: Oral care QID Follow up Recommendations: Other (comment) (TBD) SLP Visit Diagnosis: Dysphagia, unspecified (R13.10) Plan: Continue with current plan of care                       ADAMS,PAT, M.S., CCC-SLP 10/22/2016, 1:22 PM

## 2016-10-22 NOTE — Assessment & Plan Note (Signed)
Urine culture yesterday from [facility] Shaw, William, MD did not show positive growth. CEftriaxone was sstopped  Anti-infectives    Start     Dose/Rate Route Frequency Ordered Stop   10/20/16 1100  cefTRIAXone (ROCEPHIN) 1 g in dextrose 5 % 50 mL IVPB  Status:  Discontinued     1 g 100 mL/hr over 30 Minutes Intravenous Every 24 hours 10/20/16 0949 10/21/16 1217

## 2016-10-22 NOTE — Progress Notes (Signed)
PULMONARY / CRITICAL CARE MEDICINE   Name: Betty Floyd MRN: [id number] DOB: 06/14/1933    ADMISSION DATE:  10/08/2016 CONSULTATION DATE:    Cindi Carbon MD: Dr. Robb Matar   CHIEF COMPLAINT: Traumatic SAH  Brief: 83 y.o. female with PMH of Aortic Valve Stenosis, CAD (Home Plavix), HTN, Previous MI.   On 2/21 was trying to get back into bed when she suffered a mechanical fall causing her to strike the back of her head on the floor. In ED [facility] demonstrated SAH. Repeat CTH demonstrated increase in size of SAH. She was given platelet transfusion and neurosurgery was consulted.  She was given keppra for seizure prophylaxis as well as 2 units of platelets.  PCCM was called for admission.   STUDIES:  CTH 02/22 > focal acute SAH along medial left frontal lobe. Soft tissue swelling at posterior vertex. CTH 02/22 > increase in size of SAH over left frontal lobe with increase mass effect.  No hydrocephalus or MLS. 2/22 > admit. 2/23 - No distress. Neuro status improving slowly.     SUBJECTIVE/OVERNIGHT/INTERVAL HX 2/24 - per RN continued somnolence with right hemiplegia. Per Dr Bevely Palmer - no plans for surgical intervention based on comorbidities and goals of care. CT yesterday with worsening hemorrhage  VITAL SIGNS: BP (!) 153/55   Pulse 99   Temp 99.1 F (37.3 C) (Oral)   Resp (!) 24   Ht 5\' 2"  (1.575 m)   Wt 65.4 kg (144 lb 2.9 oz)   SpO2 94%   BMI 26.37 kg/m   HEMODYNAMICS:    VENTILATOR SETTINGS:    INTAKE / OUTPUT: I/O last 3 completed shifts: In: 2529.7 [I.V.:1869.7; NG/GT:300; IV Piggyback:360] Out: 2150 [Urine:2150]  PHYSICAL EXAMINATION:  General Appearance:    Looks criticall ill and VERY FRAIL  Head:    Normocephalic, without obvious abnormality, atraumatic  Eyes:    PERRL - unable to asses, conjunctiva/corneas - clear      Ears:    Normal external ear canals, both ears  Nose:   NG tube - yes  Throat:  ETT TUBE - no , OG tube - no  Neck:   Supple,  No  enlargement/tenderness/nodules     Lungs:     Clear to auscultation bilaterally,   Chest wall:    No deformity  Heart:    S1 and S2 normal, no murmur, CVP - no.  Pressors - no  Abdomen:     Soft, no masses, no organomegaly  Genitalia:    Not done  Rectal:   not done  Extremities:   Extremities- emaciated     Skin:   Intact in exposed areas . Sacral area - no bed sores reproted     Neurologic:   Sedation - prn morphine -> unresponsive       LABS: PULMONARY No results for input(s): PHART, PCO2ART, PO2ART, HCO3, TCO2, O2SAT in the last 168 hours.  Invalid input(s): PCO2, PO2  CBC  Recent Labs Lab 10/20/16 0037 10/21/16 0329 10/22/16 0546  HGB 12.1 11.2* 11.6*  HCT 38.2 34.8* 36.2  WBC 4.1 9.2 12.4*  PLT 129* 127* 157    COAGULATION  Recent Labs Lab 10/20/16 0037  INR 1.03    CARDIAC  No results for input(s): TROPONINI in the last 168 hours. No results for input(s): PROBNP in the last 168 hours.   CHEMISTRY  Recent Labs Lab 10/20/16 0037 10/21/16 0329 10/21/16 1157 10/21/16 1636 10/22/16 0546  NA 140 138  --   --  139  K 4.0 3.7  --   --  4.2  CL 102 106  --   --  104  CO2 28 24  --   --  25  GLUCOSE 115* 157*  --   --  118*  BUN 17 14  --   --  20  CREATININE 1.09* 0.78  --   --  0.81  CALCIUM 9.5 9.1  --   --  9.4  MG  --  1.5* 1.5* 2.3 2.0  PHOS  --  2.5 2.1* 1.7* 2.3*   Estimated Creatinine Clearance: 48.3 mL/min (by C-G formula based on SCr of 0.81 mg/dL).   LIVER  Recent Labs Lab 10/20/16 0037  INR 1.03     INFECTIOUS No results for input(s): LATICACIDVEN, PROCALCITON in the last 168 hours.   ENDOCRINE CBG (last 3)   Recent Labs  10/22/16 0445 10/22/16 0752 10/22/16 1124  GLUCAP 121* 115* 152*         IMAGING x48h  - image(s) personally visualized  -   highlighted in bold Ct Head Wo Contrast  Result Date: 10/21/2016 CLINICAL DATA:  83 y/o F; changes and vital signs with subarachnoid hemorrhage yesterday. EXAM:  CT HEAD WITHOUT CONTRAST TECHNIQUE: Contiguous axial images were obtained from the base of the skull through the vertex without intravenous contrast. COMPARISON:  10/20/2016 CT of the head. FINDINGS: Brain: Left paramedian frontal hematoma with parenchymal and subarachnoid components has markedly increased in size measuring 6.3 x 3.3 x 6.1 cm (AP x ML x CC series 602, image 35 and series 4, image 25). There has been interval decompression of hemorrhage into the ventricular system with pooling in occipital horns and there is a increase in subarachnoid hemorrhage with distribution over the cerebral convexities bilaterally. Increased mass effect with partial effacement of right and effacement of left frontal horns of lateral ventricles. No hydrocephalus. Left to right of midline shift of 4 mm. Vascular: Calcific atherosclerosis of the cavernous and paraclinoid internal carotid arteries. Skull: Scalp thickening at the parietal convexity compatible with contusion. Scalp skin staples overlying contusion. No displaced calvarial fracture. Sinuses/Orbits: No acute finding. Other: None. IMPRESSION: 1. Marked increase in size of left paramedian frontal parenchymal and subarachnoid hemorrhage. Interval decompression of hemorrhage into the lateral ventricles with blood in occipital horns and third ventricle. Increased subarachnoid hemorrhage distributed over the convexities. 2. Worsening mass effect with 4 mm left-to-right midline shift and effacement of frontal horns of lateral ventricles. 3. No hydrocephalus at this time. These results will be called to the ordering clinician or representative by the Radiologist Assistant, and communication documented in the PACS or zVision Dashboard. Electronically Signed   By: Mitzi Hansen M.D.   On: 10/21/2016 19:46   Dg Abd Portable 1v  Result Date: 10/21/2016 CLINICAL DATA:  NG tube placement EXAM: PORTABLE ABDOMEN - 1 VIEW COMPARISON:  None. FINDINGS: Feeding tube enters  the stomach with the tip in the right upper quadrant, most likely in the duodenum bulb region. Normal bowel gas pattern. IMPRESSION: Feeding tube tip in the region of the duodenum bulb or gastric antrum. Electronically Signed   By: Marlan Palau M.D.   On: 10/21/2016 13:47     DISCUSSION: 83 y.o. female admitted 02/22 after mechanical fall with subsequent SAH.  While in ED, had change in mental status and CTH demonstrated worsening SAH.  Seen by neurosurgery and pt deemed not a surgical candidate.   ASSESSMENT and PLAN  SAH (subarachnoid hemorrhage) (HCC) [facility] . Worse 10/21/16 on CT. On 10/22/16 continue coma and needing morphine prn for headache and distress. D/w Dr Bevely Palmeritty of NSGY - no plans to operate due to comorbidities and not c/w goals of care of patient family. Daughtger and son confirmed same to me  Plan Supportive care  DNAR (do not attempt resuscitation) DNR   Terminal care Prognosis is few to several days. Tolerating prn opioid . Doctrine of double effect explained  Plan Move to palliative floor Prn morphine and ativan IF worse transition to gtt  Atherosclerosis of native artery of extremity with intermittent claudication (HCC) Nil acute  UTI (urinary tract infection) Urine culture yesterday from [facility] Shaw, William, MD did not show positive growth. CEftriaxone was sstopped  Anti-infectives    Start     Dose/Rate Route Frequency Ordered Stop   10/20/16 1100  cefTRIAXone (ROCEPHIN) 1 g in dextrose 5 % 50 mL IVPB  Status:  Discontinued     1 g 100 mL/hr over 30 Minutes Intravenous Every 24 hours 10/20/16 0949 10/21/16 1217           FAMILY  - Updates: 10/22/2016 --> see ipal  - Inter-disciplinary family meet or Palliative Care meeting due by:  DAy 7. Current LOS is LOS 2 days - done with daughtger and son - > terminal care  CODE STATUS    Code Status Orders        Start     Ordered   10/20/16 [phone]  Do not attempt  resuscitation (DNR)  Continuous    Question Answer Comment  In the event of cardiac or respiratory ARREST Do not call a "code blue"   In the event of cardiac or respiratory ARREST Do not perform Intubation, CPR, defibrillation or ACLS   In the event of cardiac or respiratory ARREST Use medication by any route, position, wound care, and other measures to relive pain and suffering. May use oxygen, suction and manual treatment of airway obstruction as needed for comfort.      10/20/16 [id number]    Code Status History    Date Active Date Inactive Code Status Order ID Comments User Context   10/20/2016  5:48 AM 10/20/2016  6:52 AM DNR [id number]  Kathlene Coteahul P Desai, PA-C ED   10/20/2016  3:24 AM 10/20/2016  5:48 AM Full Code [id number]  Bobette Moavid Manuel Ortiz, MD ED    Advance Directive Documentation   Flowsheet Row Most Recent Value  Type of Advance Directive  Living will  Pre-existing out of facility DNR order (yellow form or pink MOST form)  No data  "MOST" Form in Place?  No data        DISPO Transfer to med surg and palliative unit. TRH primary from 10/23/16 with poccm off - d/w DR Joseph ArtWoods      Dr. Kalman ShanMurali Moriyah Byington, M.D., [facility] HospitalF.C.C.P Pulmonary and Critical Care Medicine Staff Physician [facility] [place] [facility] Pager: [phone], If no answer or between  15:00h - 7:00h: call [phone]  10/22/2016 3:06 PM

## 2016-10-22 NOTE — Progress Notes (Signed)
Pt seen and examined. No issues overnight.  EXAM: Temp:  [97.3 F (36.3 C)-100.1 F (37.8 C)] 100.1 F (37.8 C) (02/24 0800) Pulse Rate:  [88-106] 100 (02/24 0900) Resp:  [13-48] 19 (02/24 0900) BP: (116-192)/(47-136) 148/50 (02/24 0900) SpO2:  [90 %-100 %] 96 % (02/24 0900) Weight:  [65.4 kg (144 lb 2.9 oz)] 65.4 kg (144 lb 2.9 oz) (02/24 0500) Intake/Output      02/23 0701 - 02/24 0700 02/24 0701 - 02/25 0700   I.V. (mL/kg) 1269.7 (19.4) 110 (1.7)   NG/GT 300    IV Piggyback 255 105   Total Intake(mL/kg) 1824.7 (27.9) 215 (3.3)   Urine (mL/kg/hr) 1800 (1.1)    Emesis/NG output     Total Output 1800     Net +24.7 +215         Arouses to verbal stimulus Non verbal PERRL MAE, not following commands  Stable Continue current care

## 2016-10-23 DIAGNOSIS — Z7189 Other specified counseling: Secondary | ICD-10-CM

## 2016-10-23 LAB — BASIC METABOLIC PANEL
Anion gap: 14 (ref 5–15)
BUN: 18 mg/dL (ref 6–20)
CO2: 22 mmol/L (ref 22–32)
CREATININE: 0.63 mg/dL (ref 0.44–1.00)
Calcium: 9.6 mg/dL (ref 8.9–10.3)
Chloride: 104 mmol/L (ref 101–111)
GFR calc Af Amer: >60 mL/min (ref >60)
GFR calc non Af Amer: >60 mL/min (ref >60)
Glucose, Bld: 117 mg/dL — ABNORMAL HIGH (ref 65–99)
Potassium: 3.8 mmol/L (ref 3.5–5.1)
SODIUM: 140 mmol/L (ref 135–145)

## 2016-10-23 LAB — PHOSPHORUS: Phosphorus: 2.4 mg/dL — ABNORMAL LOW (ref 2.5–4.6)

## 2016-10-23 LAB — CBC
HCT: 36.1 % (ref 36.0–46.0)
Hemoglobin: 11.4 g/dL — ABNORMAL LOW (ref 12.0–15.0)
MCH: 29.2 pg (ref 26.0–34.0)
MCHC: 31.6 g/dL (ref 30.0–36.0)
MCV: 92.6 fL (ref 78.0–100.0)
PLATELETS: 189 10*3/uL (ref 150–400)
RBC: 3.9 MIL/uL (ref 3.87–5.11)
RDW: 14 % (ref 11.5–15.5)
WBC: 12.7 10*3/uL — AB (ref 4.0–10.5)

## 2016-10-23 LAB — MAGNESIUM: Magnesium: 1.8 mg/dL (ref 1.7–2.4)

## 2016-10-23 MED ORDER — GLYCOPYRROLATE 0.2 MG/ML IJ SOLN
0.3000 mg | Freq: Once | INTRAMUSCULAR | Status: AC
  Administered 2016-10-23: 0.3 mg via INTRAVENOUS
  Filled 2016-10-23: qty 2

## 2016-10-23 NOTE — Progress Notes (Signed)
OT Cancellation Note  Patient Details Name: Betty Floyd MRN: [id number] DOB: December 27, 1932   Cancelled Treatment:    Reason Eval/Treat Not Completed: Patient not medically ready (not medically appropriate for therapy at this time). Per RN; family meeting today to determine comfort care measures. Will follow up as time allows.   Gaye AlkenBailey A Amee Boothe M.S., OTR/L Pager: [phone]  10/23/2016, 1:45 PM

## 2016-10-23 NOTE — Progress Notes (Signed)
Upper airway congestion remains with noted lower airway involvement. Scheduled Atropine given, will continue to monitor for increased secretions. Family remains at bedside, no questions/concerns expressed.

## 2016-10-23 NOTE — Progress Notes (Signed)
  Speech Language Pathology Treatment: Dysphagia  Patient Details Name: Betty Floyd MRN: [id number] DOB: [birth year] Today's Date: 10/23/2016 Time: [id number] SLP Time Calculation (min) (ACUTE ONLY): 18 min  Assessment / Plan / Recommendation Clinical Impression  Dysphagia treatment provided for PO trials. Pt with eyes open/ making eye contact with SLP when approached at R side; however, attempts to communicate not noted and not following commands. Pt noted to close mouth around swab and suction tube during oral care with suction. No oral response today with small amounts of applesauce (suctioned out) but a pharyngeal swallow appeared to be elicited following small ice chip trials. Son at bedside feels that pt showing more alertness today. Pt continues to be at high risk of aspiration due to cognitive status. Recommend remaining strictly NPO with frequent oral care. Will continue to follow for PO readiness.   HPI HPI: 83 yo female admitted s/p fall with SAH in the left frontal lobe. Pt developed aphasia in ED with CT showing increased size of hematoma. PMH includes CABG x3, MI, PAD, HTN, gastroparesis, CAD      SLP Plan  Continue with current plan of care       Recommendations  Diet recommendations: NPO Medication Administration: Via alternative means                Oral Care Recommendations: Oral care QID Follow up Recommendations: Other (comment) (TBD) SLP Visit Diagnosis: Dysphagia, unspecified (R13.10) Plan: Continue with current plan of care       GO                [place] KungOleksiak, Jannetta Massey K, MA, CCC-SLP 10/23/2016, 10:06 AM [phone]

## 2016-10-23 NOTE — Consult Note (Signed)
Consultation Note Date: 10/23/2016   Patient Name: Betty Floyd  DOB: Aug 15, 1933  MRN: [id number]  Age / Sex: 83 y.o., female  PCP: Martha ClanWilliam Shaw, MD Referring Physician: Leatha Gildingostin M Gherghe, MD  Reason for Consultation: Terminal Care  HPI/Patient Profile: 83 y.o. female  admitted on 10/01/2016    Clinical Assessment and Goals of Care:  83 year old lady with a past medical history significant for aortic valve stenosis, coronary artery disease, hypertension status post MRI. Patient has complaints of chronic back pain, is limited in her mobility, was living at home with 24/7 assistance from her daughter who lives with her. On the night of 2-21, it is noted from chart review and also in my discussions with the patient's son that the patient got up out of bed and had a mechanical fall when she tried to get back in bed. She struck the back of her head on the floor was brought into the emergency department where initial CT scan of the brain showed a subarachnoid hemorrhage repeat brain imaging showed worsening of her brain bleed. She was given a platelet transfusion and was seen by Dr. York Pellantittty from neurosurgery. She was given when necessary Keppra for seizure prophylaxis. She required intubation and mechanical ventilation for airway protection. Because of underlying comorbidities and worsening hemorrhage, no plans for surgical intervention was determined as the best course of action. Patient continued to have somnolence and right-sided weakness.  Patient was compassionately liberated from mechanical ventilation by pulmonary/local care medicine service. She was [facility] to 6 [facility] tower [facility] , was continued on fentanyl continuous infusions, tube feeds were held and the primary focus of care shifted to comfort measures. Palliative consultation is now requested for ongoing terminal care, appropriate  disposition options discussions.  Patient is an elderly lady resting in bed. She has her eyes open and in a fixed upward gaze. She does not respond. She does not move her extremities purposefully. Son is present at the bedside. I introduced myself and palliative care as follows: Palliative medicine is specialized medical care for people living with serious illness. It focuses on providing relief from the symptoms and stress of a serious illness. The goal is to improve quality of life for both the patient and the family.  Brief life review performed. Son describes the patient is a very resilient person. She had a host of chronic medical conditions that had significantly decreased the quality of her life for some time now. Particularly, her functional mobility was limited by chronic back discomfort. Patient's son states that his sister, the patient's daughter was primary caregiver for the patient for about 2 years now. Daughter lived with the patient. Son states that he recognizes the patient is facing serious life limiting illness. He states he still has a " glimmer of hope." We discussed about the patient's brain imaging, the serious nature of her brain bleed. Discussed about artificial nutrition and hydration at end-of-life. All questions answered. I encouraged him to provide oral care but also discussed that the patient is  not safe for any kind of by mouth diet. Prognosis appears to be hours to days. I discussed about hospice specifically residential hospice. Son wishes to continue with hospitalization here.  Son and daughter is struggling with the patient's rapid decline that stemmed from this mechanical fall and extensive brain bleed. They state that the patient has had a host of chronic medical problems but that they had not imagined that they would lose her to a brain bleed secondary to a fall. We discussed about the dying process specifically from neurologic events. Offered active listening and  supportive care.  NEXT OF KIN  Son and daughter.  SUMMARY OF RECOMMENDATIONS    DO NOT RESUSCITATE/DO NOT INTUBATE Comfort measures only Continue fentanyl infusion and other when necessary medications for comfort No artificial nutrition or hydration. This has been discussed in detail with the patient's son in detail.  Discussed about transfer to residential hospice, son would like to continue the patient's care here for now.  Consider if the patient ought to be a medical examiner case owing to the fact that she is admitted with fall and brain bleed.   Code Status/Advance Care Planning:  DNR    Symptom Management:     Agree with fentanyl infusion, as needed medications for secretions management. Palliative Prophylaxis:   Delirium Protocol  Additional Recommendations (Limitations, Scope, Preferences):  Full Comfort Care  Psycho-social/Spiritual:   Desire for further Chaplaincy support:yes  Additional Recommendations: Education on Hospice  Prognosis:   Hours - Days  Discharge Planning: Anticipated Hospital Death      Primary Diagnoses: Present on Admission: . CAD, multiple vessel -- status post CABG x3 (LIMA-LAD, SVG-PDA, SVG-OM*that is occluded) - stents in native   I have reviewed the medical record, interviewed the patient and family, and examined the patient. The following aspects are pertinent.  Past Medical History:  Diagnosis Date  . Aortic valve stenosis, moderate 2014   Calcific stenosis with increased velocities in the mid to distal aorta consistent with the greater than 60% diameter reduction.  Marland Kitchen CAD (coronary artery disease) of artery bypass graft February 2004    Occluded radial-OM graft; DES PCI proximal and mid Circumflex (mid: 2.5 mm 18 mm Cypher DES, proximal 2.5 mm x 13 mm Cypher DES)  . CAD, multiple vessel Progression until 2000   Referred for CABG x3 by Dr. Donata Clay  . Carotid artery disease -left Most recent Dopplers July 2014    History of left carotid angioplasty (Dr. Edilia Bo); carotid Dopplers July 2014: Bilateral ICAs less than 49% reduction. Patent left common carotid-subclavian artery bypass  . Chronic back pain   . Gastritis   . Gastroparesis   . H/O echocardiogram July 2014   EF 50-55%; mild LVH. Grade 1 diastolic dysfunction. Moderate HJ of inferolateral wall. Mild AI. Mild MR.   . H/O unstable angina February 2004   Diagnostic cath showed severe circumflex disease with occluded radial graft to OM  . History of nuclear stress test  September 2012   LexiScan Myoview: Large size, moderate-severe intensity defect in the basal inferoseptal, basal inferior walls as well as basal inferolateral mid inferolateral/apical lateral walls with mild reversibility. Thought to be mild hypokinesis in this distribution but inconsistent with size of defect. Would suggest viable myocardium  . Hyperlipidemia LDL goal <70   . Hypertension     partially renovascular  . Occlusive mesenteric ischemia (HCC)  2007; Doppler July 2014   BMS - SMA; Dr. Fredia Sorrow (VIR); followup Dopplers July 2014: SMA  and celiac arteries flow velocities consistent with greater than 60% diameter reduction  . Osteoarthritis of back     and Knees  . PAD (peripheral artery disease) (HCC) Most recent Doppler July 2014    extensive bilateral disease; R>L; Multiple bypass operations: AortoBifem (1998), R Fem-Pop; R Fem-tib; Fem-Fem -- occluded; 03/17/2013  Dopplerrs: R. ABI 0; left ABI 0.06. Right CFA, SFA-, femoropopliteal, fem-tib occluded; R. PT, AT-DP occluded.  LEFT distal EIA high resistance, CFA<50%,  peroneal not visualized -  progression of L. ABI (followup 6 months)   . Peptic ulcer disease   . Posterior MI (HCC) 1976   While on OCPs; age 38  . Right renal artery stenosis (HCC) 2004; most recent Doppler July 2014   PCI: Genesis 6.0 mm x 12 mm; abdominal vessel Dopplers July 2014: Right proximal renal artery at stent equal to or less than 60%; left  proximal renal artery less than 50%. Right and left kidneys are equal size and symmetric. Left lower kidney pole with a 0.7 cm x 2.9 centimeter cyst  . S/P CABG x 3 2000   LIMA-LAD, SVG-RPDA, LRad-OM (known to be occluded)  . S/P cardiac catheterization  May 2010   Patent pedicled LIMA-LAD, patent SVG-PDA, patent circumflex stents. Also patent left carotid-left subclavian bypass;; apical and distal inferior posterior apical hypokinesis  . Small bowel obstruction due to adhesions October 2004   Status post LOA  . Subclavian artery stenosis, left (HCC) Most recent Dopplers 2014    100% occlusion; status post left carotid-subclavian bypass (no evidence of steal phenomenon by cath in 2010, with adequate LIMA flow)   Social History   Social History  . Marital status: Divorced    Spouse name: N/A  . Number of children: N/A  . Years of education: N/A   Social History Main Topics  . Smoking status: Former Smoker    Packs/day: 1.00    Years: 20.00    Types: Cigarettes    Quit date: 05/03/1986  . Smokeless tobacco: Never Used  . Alcohol use No  . Drug use: No  . Sexual activity: Not Asked   Other Topics Concern  . None   Social History Narrative   Divorced white woman. Mother of 2. Accompanied by her daughter who usually accompanies her. As live with her now for just over 2 years.   She quit smoking in 1987 after greater than 20 years of one pack per day.   She denies any alcohol use.   She is in Lipitor he around the house, but does not walk much due to a combination of significant claudication mostly in her right foot but now also on her left. She also has significant back related pain. This limits her activities.   Family History  Problem Relation Age of Onset  . Diabetes Mother   . Heart disease Father   . Heart disease Sister   . Heart disease Brother   . Heart attack Brother    Scheduled Meds: . atropine  2 drop Sublingual QID  . chlorhexidine  15 mL Mouth Rinse BID  .  feeding supplement (PRO-STAT SUGAR FREE 64)  30 mL Per Tube Daily  . levETIRAcetam  500 mg Intravenous Q12H  . mouth rinse  15 mL Mouth Rinse q12n4p  . pantoprazole (PROTONIX) IV  40 mg Intravenous Q24H  . pregabalin  100 mg Per Tube TID   Continuous Infusions: . sodium chloride 50 mL/hr at 10/22/16 1900  . feeding supplement (JEVITY 1.2 CAL)  Stopped (10/21/16 1905)  . fentaNYL infusion INTRAVENOUS 95 mcg/hr (10/23/16 0837)   PRN Meds:.sodium chloride, fentaNYL (SUBLIMAZE) injection, hydrALAZINE, labetalol, morphine injection, ondansetron (ZOFRAN) IV, oxyCODONE Medications Prior to Admission:  Prior to Admission medications   Medication Sig Start Date End Date Taking? Authorizing Provider  acetaminophen (TYLENOL) 500 MG tablet Take 1,000 mg by mouth every 8 (eight) hours.    Yes Historical Provider, MD  ALPRAZolam Prudy Feeler) 0.25 MG tablet Take 0.5 mg by mouth at bedtime as needed for sleep.   Yes Historical Provider, MD  aspirin 81 MG tablet Take 81 mg by mouth every morning.    Yes Historical Provider, MD  atorvastatin (LIPITOR) 20 MG tablet Take 20 mg by mouth every morning.    Yes Historical Provider, MD  Cholecalciferol (VITAMIN D3) 1000 UNITS CAPS Take 1,000 Units by mouth daily.    Yes Historical Provider, MD  cilostazol (PLETAL) 100 MG tablet Take 100 mg by mouth 2 (two) times daily.     Yes Historical Provider, MD  clopidogrel (PLAVIX) 75 MG tablet Take 75 mg by mouth daily.    Yes Historical Provider, MD  DULoxetine (CYMBALTA) 60 MG capsule Take 60 mg by mouth every evening.    Yes Historical Provider, MD  fenofibrate 160 MG tablet Take 160 mg by mouth every morning.    Yes Historical Provider, MD  isosorbide mononitrate (IMDUR) 30 MG 24 hr tablet Take 30 mg by mouth daily at 6 PM.    Yes Historical Provider, MD  losartan (COZAAR) 50 MG tablet Take 50 mg by mouth every morning.    Yes Historical Provider, MD  metoprolol tartrate (LOPRESSOR) 25 MG tablet Take 25 mg by mouth 2 (two)  times daily.     Yes Historical Provider, MD  naloxegol oxalate (MOVANTIK) 25 MG TABS tablet Take 25 mg by mouth daily.   Yes Historical Provider, MD  NITROSTAT 0.4 MG SL tablet Place 0.4 mg under the tongue every 5 (five) minutes as needed for chest pain.  11/08/13  Yes Historical Provider, MD  nystatin cream (MYCOSTATIN) Apply 1 application topically 2 (two) times daily as needed for dry skin.   Yes Historical Provider, MD  Omega-3 Fatty Acids (FISH OIL) 1000 MG CAPS Take 1 capsule by mouth every morning.    Yes Historical Provider, MD  ondansetron (ZOFRAN) 4 MG tablet Take 4 mg by mouth every 8 (eight) hours as needed for nausea or vomiting.   Yes Historical Provider, MD  oxyCODONE (OXY IR/ROXICODONE) 5 MG immediate release tablet Take 5 mg by mouth every 6 (six) hours as needed for pain. 03/14/13  Yes Sena Hitch, MD  pantoprazole (PROTONIX) 40 MG tablet Take 40 mg by mouth 2 (two) times daily.     Yes Historical Provider, MD  polyethylene glycol (MIRALAX / GLYCOLAX) packet Take 17-34 g by mouth every morning.    Yes Historical Provider, MD  pregabalin (LYRICA) 100 MG capsule Take 100 mg by mouth 3 (three) times daily.     Yes Historical Provider, MD  sulfamethoxazole-trimethoprim (BACTRIM DS,SEPTRA DS) 800-160 MG tablet Take 1 tablet by mouth 2 (two) times daily. 10/18/16  Yes Historical Provider, MD  temazepam (RESTORIL) 15 MG capsule Take 30 mg by mouth at bedtime as needed for sleep.    Yes Historical Provider, MD  traMADol (ULTRAM) 50 MG tablet Take 100 mg by mouth every 6 (six) hours.    Yes Historical Provider, MD   Allergies  Allergen Reactions  . Phenergan [Promethazine Hcl] Hives  .  Oxycodone Other (See Comments)    Causes Hallucinations makes her go crazy.  Patient seems to think that she can take med very short term.   . Contrast Media [Iodinated Diagnostic Agents] Hives, Itching and Rash    Benadryl works to counteract reaction  . Iohexol Hives, Itching and Rash    01-11-06---pt  had erythema & mild itching with 13 hr prep, Onset Date: 16109604   . Promethazine Hives, Itching and Rash  . Vicodin [Hydrocodone-Acetaminophen] Hives, Itching and Rash   Review of Systems + for dyspnea.   Physical Exam Frail elderly lady resting in bed has NG tube Coarse breath sounds anterior lung fields S1-S2 muffled by adventitious lung sounds Thin extremities no edema Abdomen soft mildly distended Eyes open, gaze fixed upwards, does not follow commands does not verbalize  Vital Signs: BP (!) 183/64 (BP Location: Left Wrist) Comment: nurse notfiy  Pulse 97   Temp 99.6 F (37.6 C) (Oral)   Resp 18   Ht 5\' 2"  (1.575 m)   Wt 65.4 kg (144 lb 2.9 oz)   SpO2 100%   BMI 26.37 kg/m  Pain Assessment: PAINAD   Pain Score: 0-No pain   SpO2: SpO2: 100 % O2 Device:SpO2: 100 % O2 Flow Rate: .O2 Flow Rate (L/min): 6 L/min  IO: Intake/output summary:  Intake/Output Summary (Last 24 hours) at 10/23/16 1021 Last data filed at 10/23/16 0100  Gross per 24 hour  Intake             1010 ml  Output              375 ml  Net              635 ml    LBM: Last BM Date: 10/02/2016 (prior to admission) Baseline Weight: Weight: 67.4 kg (148 lb 9.4 oz) Most recent weight: Weight: 65.4 kg (144 lb 2.9 oz)     Palliative Assessment/Data:   Flowsheet Rows   Flowsheet Row Most Recent Value  Intake Tab  Referral Department  Hospitalist  Unit at Time of Referral  Med/Surg Unit  Palliative Care Primary Diagnosis  Other (Comment) [brain bleed]  Palliative Care Type  New Palliative care  Reason for referral  Counsel Regarding Hospice, End of Life Care Assistance, Clarify Goals of Care  Clinical Assessment  Palliative Performance Scale Score  10%  Pain Max last 24 hours  4  Pain Min Last 24 hours  3  Dyspnea Max Last 24 Hours  6  Nausea Max Last 24 Hours  0  Nausea Min Last 24 Hours  0  Anxiety Max Last 24 Hours  3  Anxiety Min Last 24 Hours  2  Psychosocial & Spiritual Assessment    Palliative Care Outcomes  Patient/Family meeting held?  Yes  Who was at the meeting?  son  Palliative Care Outcomes  Clarified goals of care      Time In:  9 Time Out:  10.10 Time Total:  70 min  Greater than 50%  of this time was spent counseling and coordinating care related to the above assessment and plan.  Signed by: Rosalin Hawking, MD  [phone]  Please contact Palliative Medicine Team phone at [phone] for questions and concerns.  For individual provider: See Loretha Stapler

## 2016-10-23 NOTE — Progress Notes (Signed)
PROGRESS NOTE  Betty Floyd WUJ:[id number] DOB: 1933/03/17 DOA: 10-29-2016 PCP: Martha Clan, MD   LOS: 3 days   Brief Narrative: 83 y.o.femalewith PMH of Aortic Valve Stenosis, CAD (Home Plavix), HTN, Previous MI, who was admitted on ICU on 2/22 with subarachnoid hemorrhage in the setting of a mechanical fall.  Neurosurgery was consulted.  She underwent repeat CT scans which showed that her subarachnoid hemorrhage is increasing in size.  She remains obtunded.  Decision was made to transition patient to comfort care and she was transferred to the floor on 2/24, and TRH took over 2/25.  Assessment & Plan: Principal Problem:   SAH (subarachnoid hemorrhage) (HCC) Active Problems:   CAD, multiple vessel -- status post CABG x3 (LIMA-LAD, SVG-PDA, SVG-OM*that is occluded) - stents in native   DNAR (do not attempt resuscitation)   Terminal care  Goals of care -Patient admitted on 2/22 with a subarachnoid hemorrhage in the setting of ground level fall.  Repeat CT scan on 2/23 showed worsening of her hemorrhage.  Neurosurgery was consulted and they did not recommend surgery due to comorbidities, and not consistent with goals of care per discussion between neurosurgery and family. -She was transferred to regular floor on 2/24, with focus of care towards comfort rather than aggressive measures. -I discussed with patient's son at bedside, he actually is relatively optimistic this morning regarding the fact that patient is starting to open her eyes, tries to mouth some words and is moving her left arm.  He is asking and hoping for recovery, and approaches discussions about feeding tube.  I doubt she will have any meaningful recovery, and I discussed with son at bedside.  I discussed case with Dr. Linna Darner with palliative care.  Subarachnoid hemorrhage -Worsening post most recent CT scan, not a surgical candidate per neurosurgery. -She was on Plavix, aspirin and Pletal prior to admission, now on  hold  Coronary artery disease s/p CABG -Comfort Care approach currently  Peripheral vascular disease -Cannot use any antiplatelets currently.  Comfort approach.  Hyperlipidemia -Unable to take p.o., discontinue statin   DVT prophylaxis: Comfort care Code Status: DNR Family Communication: Discussed with son at bedside Disposition Plan: To be determined  Consultants:   PCCM  Neurosurgery   Palliative  Procedures:   None   Antimicrobials:  None    Subjective: -Patient is poorly responsive, opens eyes, she is able to move her left arm spontaneously however does not follow commands  Objective: Vitals:   10/22/16 1700 10/22/16 1800 10/22/16 1900 10/22/16 2013  BP: (!) 148/52 (!) 156/61 (!) 156/58 (!) 183/64  Pulse: 89 88 86 97  Resp: 18 17 18 18   Temp:      TempSrc:      SpO2: 99% 100% 100% 100%  Weight:      Height:        Intake/Output Summary (Last 24 hours) at 10/23/16 1012 Last data filed at 10/23/16 0100  Gross per 24 hour  Intake             1010 ml  Output              375 ml  Net              635 ml   Filed Weights   10/20/16 0800 10/21/16 0400 10/22/16 0500  Weight: 67.4 kg (148 lb 9.4 oz) 67.1 kg (147 lb 14.9 oz) 65.4 kg (144 lb 2.9 oz)    Examination: Constitutional: Unresponsive Vitals:   10/22/16 1700  10/22/16 1800 10/22/16 1900 10/22/16 2013  BP: (!) 148/52 (!) 156/61 (!) 156/58 (!) 183/64  Pulse: 89 88 86 97  Resp: 18 17 18 18   Temp:      TempSrc:      SpO2: 99% 100% 100% 100%  Weight:      Height:       Eyes: lids and conjunctivae normal Respiratory: clear to auscultation bilaterally, no wheezing, no crackles.  Cardiovascular: Regular rate and rhythm, no murmurs / rubs / gallops.  Abdomen: no tenderness. Bowel sounds positive.   Data Reviewed: I have personally reviewed following labs and imaging studies  CBC:  Recent Labs Lab 10/20/16 0037 10/21/16 0329 10/22/16 0546 10/23/16 0519  WBC 4.1 9.2 12.4* 12.7*  NEUTROABS  2.8  --   --   --   HGB 12.1 11.2* 11.6* 11.4*  HCT 38.2 34.8* 36.2 36.1  MCV 92.3 90.4 91.9 92.6  PLT 129* 127* 157 189   Basic Metabolic Panel:  Recent Labs Lab 10/20/16 0037  10/21/16 0329 10/21/16 1157 10/21/16 1636 10/22/16 0546 10/22/16 1701 10/23/16 0519  NA 140  --  138  --   --  139  --  140  K 4.0  --  3.7  --   --  4.2  --  3.8  CL 102  --  106  --   --  104  --  104  CO2 28  --  24  --   --  25  --  22  GLUCOSE 115*  --  157*  --   --  118*  --  117*  BUN 17  --  14  --   --  20  --  18  CREATININE 1.09*  --  0.78  --   --  0.81  --  0.63  CALCIUM 9.5  --  9.1  --   --  9.4  --  9.6  MG  --   < > 1.5* 1.5* 2.3 2.0 1.9 1.8  PHOS  --   < > 2.5 2.1* 1.7* 2.3* 2.1* 2.4*  < > = values in this interval not displayed. GFR: Estimated Creatinine Clearance: 48.9 mL/min (by C-G formula based on SCr of 0.63 mg/dL). Liver Function Tests: No results for input(s): AST, ALT, ALKPHOS, BILITOT, PROT, ALBUMIN in the last 168 hours. No results for input(s): LIPASE, AMYLASE in the last 168 hours. No results for input(s): AMMONIA in the last 168 hours. Coagulation Profile:  Recent Labs Lab 10/20/16 0037  INR 1.03   Cardiac Enzymes: No results for input(s): CKTOTAL, CKMB, CKMBINDEX, TROPONINI in the last 168 hours. BNP (last 3 results) No results for input(s): PROBNP in the last 8760 hours. HbA1C: No results for input(s): HGBA1C in the last 72 hours. CBG:  Recent Labs Lab 10/21/16 2343 10/22/16 0445 10/22/16 0752 10/22/16 1124 10/22/16 1524  GLUCAP 117* 121* 115* 152* 131*   Lipid Profile: No results for input(s): CHOL, HDL, LDLCALC, TRIG, CHOLHDL, LDLDIRECT in the last 72 hours. Thyroid Function Tests: No results for input(s): TSH, T4TOTAL, FREET4, T3FREE, THYROIDAB in the last 72 hours. Anemia Panel: No results for input(s): VITAMINB12, FOLATE, FERRITIN, TIBC, IRON, RETICCTPCT in the last 72 hours. Urine analysis:    Component Value Date/Time   COLORURINE  YELLOW 08/14/2009 1601   APPEARANCEUR CLOUDY (A) 08/14/2009 1601   LABSPEC 1.030 08/14/2009 1601   PHURINE 6.5 08/14/2009 1601   GLUCOSEU NEGATIVE 08/14/2009 1601   HGBUR LARGE (A) 08/14/2009 1601  BILIRUBINUR NEGATIVE 08/14/2009 1601   KETONESUR NEGATIVE 08/14/2009 1601   PROTEINUR NEGATIVE 08/14/2009 1601   UROBILINOGEN 1.0 08/14/2009 1601   NITRITE NEGATIVE 08/14/2009 1601   LEUKOCYTESUR NEGATIVE 08/14/2009 1601   Sepsis Labs: Invalid input(s): PROCALCITONIN, LACTICIDVEN  Recent Results (from the past 240 hour(s))  MRSA PCR Screening     Status: None   Collection Time: 10/20/16  6:15 AM  Result Value Ref Range Status   MRSA by PCR NEGATIVE NEGATIVE Final    Comment:        The GeneXpert MRSA Assay (FDA approved for NASAL specimens only), is one component of a comprehensive MRSA colonization surveillance program. It is not intended to diagnose MRSA infection nor to guide or monitor treatment for MRSA infections.       Radiology Studies: Ct Head Wo Contrast  Result Date: 10/21/2016 CLINICAL DATA:  83 y/o F; changes and vital signs with subarachnoid hemorrhage yesterday. EXAM: CT HEAD WITHOUT CONTRAST TECHNIQUE: Contiguous axial images were obtained from the base of the skull through the vertex without intravenous contrast. COMPARISON:  10/20/2016 CT of the head. FINDINGS: Brain: Left paramedian frontal hematoma with parenchymal and subarachnoid components has markedly increased in size measuring 6.3 x 3.3 x 6.1 cm (AP x ML x CC series 602, image 35 and series 4, image 25). There has been interval decompression of hemorrhage into the ventricular system with pooling in occipital horns and there is a increase in subarachnoid hemorrhage with distribution over the cerebral convexities bilaterally. Increased mass effect with partial effacement of right and effacement of left frontal horns of lateral ventricles. No hydrocephalus. Left to right of midline shift of 4 mm. Vascular:  Calcific atherosclerosis of the cavernous and paraclinoid internal carotid arteries. Skull: Scalp thickening at the parietal convexity compatible with contusion. Scalp skin staples overlying contusion. No displaced calvarial fracture. Sinuses/Orbits: No acute finding. Other: None. IMPRESSION: 1. Marked increase in size of left paramedian frontal parenchymal and subarachnoid hemorrhage. Interval decompression of hemorrhage into the lateral ventricles with blood in occipital horns and third ventricle. Increased subarachnoid hemorrhage distributed over the convexities. 2. Worsening mass effect with 4 mm left-to-right midline shift and effacement of frontal horns of lateral ventricles. 3. No hydrocephalus at this time. These results will be called to the ordering clinician or representative by the Radiologist Assistant, and communication documented in the PACS or zVision Dashboard. Electronically Signed   By: Mitzi Hansen M.D.   On: 10/21/2016 19:46   Dg Abd Portable 1v  Result Date: 10/21/2016 CLINICAL DATA:  NG tube placement EXAM: PORTABLE ABDOMEN - 1 VIEW COMPARISON:  None. FINDINGS: Feeding tube enters the stomach with the tip in the right upper quadrant, most likely in the duodenum bulb region. Normal bowel gas pattern. IMPRESSION: Feeding tube tip in the region of the duodenum bulb or gastric antrum. Electronically Signed   By: Marlan Palau M.D.   On: 10/21/2016 13:47     Scheduled Meds: . atorvastatin  20 mg Per Tube q morning - 10a  . atropine  2 drop Sublingual QID  . chlorhexidine  15 mL Mouth Rinse BID  . feeding supplement (PRO-STAT SUGAR FREE 64)  30 mL Per Tube Daily  . levETIRAcetam  500 mg Intravenous Q12H  . mouth rinse  15 mL Mouth Rinse q12n4p  . pantoprazole (PROTONIX) IV  40 mg Intravenous Q24H  . pregabalin  100 mg Per Tube TID   Continuous Infusions: . sodium chloride 50 mL/hr at 10/22/16 1900  . feeding  supplement (JEVITY 1.2 CAL) Stopped (10/21/16 1905)  .  fentaNYL infusion INTRAVENOUS 95 mcg/hr (10/23/16 0837)     Time spent: 25 minutes, more than 50% of time was spent at bedside discussing goals of care with patient's son   Pamella Pert, MD, PhD [facility] Pager [phone]  If 7PM-7AM, please contact night-coverage [email or web address] Password [email or web address] 10/23/2016, 10:12 AM

## 2016-10-23 NOTE — Progress Notes (Signed)
Betty BouchardK. Floyd paged to see if pt. Could get med for secretions via iv. Pt. Not opening mouth for atropine drops sublingual. New orders placed and per Betty Floyd, monitor pt.'s condition throughout the night and page her if pt. Needs more medication for secretions. Night nurse notified.

## 2016-10-23 NOTE — Progress Notes (Signed)
Shift assessment complete, upper airway congestion audible. Lungs clear, respirations even and unlabored. Patient resting comfortably, family at bedside; no questions/concerns expressed.

## 2016-10-24 DIAGNOSIS — L8990 Pressure ulcer of unspecified site, unspecified stage: Secondary | ICD-10-CM | POA: Insufficient documentation

## 2016-10-24 MED ORDER — GLYCOPYRROLATE 0.2 MG/ML IJ SOLN: 0.3000 mg | INTRAMUSCULAR | Status: DC | PRN

## 2016-10-24 MED ORDER — SCOPOLAMINE 1 MG/3DAYS TD PT72
1.0000 | MEDICATED_PATCH | TRANSDERMAL | Status: DC
  Administered 2016-10-24 – 2016-10-27 (×2): 1.5 mg via TRANSDERMAL
  Filled 2016-10-24 (×2): qty 1

## 2016-10-24 NOTE — Progress Notes (Signed)
Fentanyl increased to 1115ml/hr for comfort

## 2016-10-24 NOTE — Progress Notes (Signed)
Several attempts to provide mouth care to pt met with resistance. Family aware that mouth care has been attempted

## 2016-10-24 NOTE — Progress Notes (Signed)
OT Cancellation Note  Patient Details Name: Betty Floyd MRN: [id number] DOB: Jan 17, 1933   Cancelled Treatment:    Reason Eval/Treat Not Completed: Medical issues which prohibited therapy;Other (comment) (Pt put on comfort care; will sign off)  Dana CorporationCharis M Ember Gottwald Carli Lefevers, OTR/L [phone]  10/24/2016, 11:18 AM

## 2016-10-24 NOTE — Progress Notes (Deleted)
Pt moaning and groaning when reassessed, fentanyl increased to 12.5mg /hr following consultation with family

## 2016-10-24 NOTE — Progress Notes (Signed)
Fentanyl drip increased to 12.395ml/hr following consultation with family. Pt noted to be moaning and groaning on reassessment

## 2016-10-24 NOTE — Progress Notes (Signed)
PT Cancellation Note  Patient Details Name: Betty Y Setterlund Manning CharityMRN: [id number] DOB: Feb 02, 1933   Cancelled Treatment:     Medical issues which prohibited therapy;Other (comment) (Pt put on comfort care; will sign off)   Fabio AsaDevon J Kaleeya Floyd 10/24/2016, 1:05 PM

## 2016-10-24 NOTE — Progress Notes (Signed)
Patient ID: Betty Floyd, female   DOB: [birth year], 83 y.o.   MRN: [id number] BP (!) 165/66 (BP Location: Left Arm)   Pulse (!) 106   Temp 98.4 F (36.9 C) (Axillary)   Resp 15   Ht 5\' 2"  (1.575 m)   Wt 65.4 kg (144 lb 2.9 oz)   SpO2 99%   BMI 26.37 kg/m  Opens eyes to voice, left conjugate gaze preference Does not follow commands No spontaneous movement of extremities tonight No change neurologically Even with the new scan findings, this hemorrhage is simply not in a position to cause transtentorial herniation. The basal cisterns remain widely patent, no pressure whatsoever on the brain stem.  Would continue with current care.

## 2016-10-24 NOTE — Care Management Note (Signed)
Case Management Note  Patient Details  Name: Betty CharityJuanita Y Losasso MRN: [id number] Date of Birth: 1932/12/31  Subjective/Objective:                    Action/Plan:  titration of Fentanyl drip will follow for GIP vs Residential hospice  Expected Discharge Date:                  Expected Discharge Plan:     In-House Referral:     Discharge planning Services  CM Consult  Post Acute Care Choice:    Choice offered to:     DME Arranged:    DME Agency:     HH Arranged:    HH Agency:     Status of Service:  In process, will continue to follow  If discussed at Long Length of Stay Meetings, dates discussed:    Additional Comments:  Kingsley PlanWile, Tian Mcmurtrey Marie, RN 10/24/2016, 2:12 PM

## 2016-10-24 NOTE — Progress Notes (Signed)
PROGRESS NOTE  Betty Floyd:[id number] DOB: July 21, 1933 DOA: 11-12-16 PCP: Martha Clan, MD   LOS: 4 days   Brief Narrative: 83 y.o.femalewith PMH of Aortic Valve Stenosis, CAD (Home Plavix), HTN, Previous MI, who was admitted on ICU on 2/22 with subarachnoid hemorrhage in the setting of a mechanical fall.  Neurosurgery was consulted.  She underwent repeat CT scans which showed that her subarachnoid hemorrhage is increasing in size.  She remains obtunded.  Decision was made to transition patient to comfort care and she was transferred to the floor on 2/24, and TRH took over 2/25.  Assessment & Plan: Principal Problem:   SAH (subarachnoid hemorrhage) (HCC) Active Problems:   CAD, multiple vessel -- status post CABG x3 (LIMA-LAD, SVG-PDA, SVG-OM*that is occluded) - stents in native   DNAR (do not attempt resuscitation)   Terminal care   Goals of care, counseling/discussion  Goals of care -Patient admitted on 2/22 with a subarachnoid hemorrhage in the setting of ground level fall.  Repeat CT scan on 2/23 showed worsening of her hemorrhage.  Neurosurgery was consulted and they did not recommend surgery due to comorbidities, and not consistent with goals of care per discussion between neurosurgery and family. -She was transferred to regular floor on 2/24, with focus of care towards comfort rather than aggressive measures. -I discussed with patient's son at bedside 2/25.  I doubt she will have any meaningful recovery, and I discussed with son at bedside. -Anticipate in-hospital death versus residential hospice if she survive over the next couple of days -Mental status worse today, she unresponsive  Subarachnoid hemorrhage -Worsening post most recent CT scan, not a surgical candidate per neurosurgery. -She was on Plavix, aspirin and Pletal prior to admission, now on hold  Coronary artery disease s/p CABG -Comfort Care approach currently  Peripheral vascular disease -Cannot use  any antiplatelets currently.  Comfort approach.  Hyperlipidemia -Unable to take p.o., discontinue statin   DVT prophylaxis: Comfort care Code Status: DNR Family Communication: No family at bedside this morning Disposition Plan: To be determined  Consultants:   PCCM  Neurosurgery   Palliative  Procedures:   None   Antimicrobials:  None    Subjective: -Unresponsive, labored breathing  Objective: Vitals:   10/23/16 1301 10/23/16 1329 10/23/16 1416 10/24/16 0628  BP: (!) 213/77 (!) 181/66 (!) 156/62 (!) 165/66  Pulse:    (!) 106  Resp:    15  Temp:   98.4 F (36.9 C)   TempSrc:   Axillary   SpO2:    99%  Weight:      Height:        Intake/Output Summary (Last 24 hours) at 10/24/16 0945 Last data filed at 10/24/16 9562  Gross per 24 hour  Intake               60 ml  Output             1350 ml  Net            -1290 ml   Filed Weights   10/20/16 0800 10/21/16 0400 10/22/16 0500  Weight: 67.4 kg (148 lb 9.4 oz) 67.1 kg (147 lb 14.9 oz) 65.4 kg (144 lb 2.9 oz)    Examination: Constitutional: Unresponsive Vitals:   10/23/16 1301 10/23/16 1329 10/23/16 1416 10/24/16 0628  BP: (!) 213/77 (!) 181/66 (!) 156/62 (!) 165/66  Pulse:    (!) 106  Resp:    15  Temp:   98.4 F (36.9 C)  TempSrc:   Axillary   SpO2:    99%  Weight:      Height:       Eyes: lids and conjunctivae normal Respiratory: clear to auscultation bilaterally, no wheezing, no crackles.  Cardiovascular: Regular rate and rhythm, no murmurs / rubs / gallops.    Data Reviewed: I have personally reviewed following labs and imaging studies  CBC:  Recent Labs Lab 10/20/16 0037 10/21/16 0329 10/22/16 0546 10/23/16 0519  WBC 4.1 9.2 12.4* 12.7*  NEUTROABS 2.8  --   --   --   HGB 12.1 11.2* 11.6* 11.4*  HCT 38.2 34.8* 36.2 36.1  MCV 92.3 90.4 91.9 92.6  PLT 129* 127* 157 189   Basic Metabolic Panel:  Recent Labs Lab 10/20/16 0037  10/21/16 0329 10/21/16 1157 10/21/16 1636  10/22/16 0546 10/22/16 1701 10/23/16 0519  NA 140  --  138  --   --  139  --  140  K 4.0  --  3.7  --   --  4.2  --  3.8  CL 102  --  106  --   --  104  --  104  CO2 28  --  24  --   --  25  --  22  GLUCOSE 115*  --  157*  --   --  118*  --  117*  BUN 17  --  14  --   --  20  --  18  CREATININE 1.09*  --  0.78  --   --  0.81  --  0.63  CALCIUM 9.5  --  9.1  --   --  9.4  --  9.6  MG  --   < > 1.5* 1.5* 2.3 2.0 1.9 1.8  PHOS  --   < > 2.5 2.1* 1.7* 2.3* 2.1* 2.4*  < > = values in this interval not displayed. GFR: Estimated Creatinine Clearance: 48.9 mL/min (by C-G formula based on SCr of 0.63 mg/dL). Liver Function Tests: No results for input(s): AST, ALT, ALKPHOS, BILITOT, PROT, ALBUMIN in the last 168 hours. No results for input(s): LIPASE, AMYLASE in the last 168 hours. No results for input(s): AMMONIA in the last 168 hours. Coagulation Profile:  Recent Labs Lab 10/20/16 0037  INR 1.03   Cardiac Enzymes: No results for input(s): CKTOTAL, CKMB, CKMBINDEX, TROPONINI in the last 168 hours. BNP (last 3 results) No results for input(s): PROBNP in the last 8760 hours. HbA1C: No results for input(s): HGBA1C in the last 72 hours. CBG:  Recent Labs Lab 10/21/16 2343 10/22/16 0445 10/22/16 0752 10/22/16 1124 10/22/16 1524  GLUCAP 117* 121* 115* 152* 131*   Lipid Profile: No results for input(s): CHOL, HDL, LDLCALC, TRIG, CHOLHDL, LDLDIRECT in the last 72 hours. Thyroid Function Tests: No results for input(s): TSH, T4TOTAL, FREET4, T3FREE, THYROIDAB in the last 72 hours. Anemia Panel: No results for input(s): VITAMINB12, FOLATE, FERRITIN, TIBC, IRON, RETICCTPCT in the last 72 hours. Urine analysis:    Component Value Date/Time   COLORURINE YELLOW 08/14/2009 1601   APPEARANCEUR CLOUDY (A) 08/14/2009 1601   LABSPEC 1.030 08/14/2009 1601   PHURINE 6.5 08/14/2009 1601   GLUCOSEU NEGATIVE 08/14/2009 1601   HGBUR LARGE (A) 08/14/2009 1601   BILIRUBINUR NEGATIVE 08/14/2009  1601   KETONESUR NEGATIVE 08/14/2009 1601   PROTEINUR NEGATIVE 08/14/2009 1601   UROBILINOGEN 1.0 08/14/2009 1601   NITRITE NEGATIVE 08/14/2009 1601   LEUKOCYTESUR NEGATIVE 08/14/2009 1601   Sepsis Labs: Invalid input(s): PROCALCITONIN,  LACTICIDVEN  Recent Results (from the past 240 hour(s))  MRSA PCR Screening     Status: None   Collection Time: 10/20/16  6:15 AM  Result Value Ref Range Status   MRSA by PCR NEGATIVE NEGATIVE Final    Comment:        The GeneXpert MRSA Assay (FDA approved for NASAL specimens only), is one component of a comprehensive MRSA colonization surveillance program. It is not intended to diagnose MRSA infection nor to guide or monitor treatment for MRSA infections.       Radiology Studies: No results found.   Scheduled Meds: . atropine  2 drop Sublingual QID  . chlorhexidine  15 mL Mouth Rinse BID  . feeding supplement (PRO-STAT SUGAR FREE 64)  30 mL Per Tube Daily  . levETIRAcetam  500 mg Intravenous Q12H  . mouth rinse  15 mL Mouth Rinse q12n4p  . pantoprazole (PROTONIX) IV  40 mg Intravenous Q24H  . pregabalin  100 mg Per Tube TID  . scopolamine  1 patch Transdermal Q72H   Continuous Infusions: . sodium chloride 50 mL/hr at 10/24/16 0134  . feeding supplement (JEVITY 1.2 CAL) Stopped (10/21/16 1905)  . fentaNYL infusion INTRAVENOUS 115 mcg/hr (10/23/16 1900)    Pamella Pertostin Gherghe, MD, PhD [facility] Pager [phone]  If 7PM-7AM, please contact night-coverage [email or web address] Password [email or web address] 10/24/2016, 9:45 AM

## 2016-10-24 NOTE — Progress Notes (Signed)
SLP Cancellation Note  Patient Details Name: Betty CharityJuanita Y Sites MRN: [id number] DOB: [birth year]   Cancelled treatment:       Reason Eval/Treat Not Completed: Medical issues which prohibited therapy   Nursing notified SLP that pt is on comfort measures and is not longer appropriate for ST services. ST to sign off.   Aubrii Sharpless B. Dreama Saaverton, M.S., CCC-SLP Speech-Language Pathologist    Criss Bartles 10/24/2016, 12:46 PM

## 2016-10-24 NOTE — Progress Notes (Signed)
Daily Progress Note   Patient Name: Betty Floyd       Date: 10/24/2016 DOB: [birth year]  Age: 83 y.o. MRN#: [id number] Attending Physician: Leatha Gilding, MD Primary Care Physician: Martha Clan, MD Admit Date: 11-16-2016  Reason for Consultation/Follow-up: Terminal Care   Life limiting illness: brain bleed.   Subjective: Unresponsive Noisy breathing No distress noted Son at bedside, see below.    Length of Stay: 4  Current Medications: Scheduled Meds:  . atropine  2 drop Sublingual QID  . chlorhexidine  15 mL Mouth Rinse BID  . feeding supplement (PRO-STAT SUGAR FREE 64)  30 mL Per Tube Daily  . levETIRAcetam  500 mg Intravenous Q12H  . mouth rinse  15 mL Mouth Rinse q12n4p  . pantoprazole (PROTONIX) IV  40 mg Intravenous Q24H  . pregabalin  100 mg Per Tube TID  . scopolamine  1 patch Transdermal Q72H    Continuous Infusions: . sodium chloride 50 mL/hr at 10/24/16 0134  . feeding supplement (JEVITY 1.2 CAL) Stopped (10/21/16 1905)  . fentaNYL infusion INTRAVENOUS 115 mcg/hr (10/23/16 1900)    PRN Meds: sodium chloride, fentaNYL (SUBLIMAZE) injection, glycopyrrolate, hydrALAZINE, labetalol, morphine injection, ondansetron (ZOFRAN) IV, oxyCODONE  Physical Exam         Unresponsive Shallow gurgling respirations S1 S2 Abdomen distended No edema No coolness no mottling of extremities noted  Vital Signs: BP (!) 165/66 (BP Location: Left Arm)   Pulse (!) 106   Temp 98.4 F (36.9 C) (Axillary)   Resp 15   Ht 5\' 2"  (1.575 m)   Wt 65.4 kg (144 lb 2.9 oz)   SpO2 99%   BMI 26.37 kg/m  SpO2: SpO2: 99 % O2 Device: O2 Device: Nasal Cannula O2 Flow Rate: O2 Flow Rate (L/min): 6 L/min  Intake/output summary:  Intake/Output Summary (Last 24 hours) at 10/24/16  1033 Last data filed at 10/24/16 1033  Gross per 24 hour  Intake                0 ml  Output             1500 ml  Net            -1500 ml   LBM: Last BM Date: 11-16-16 (prior to admission) Baseline Weight: Weight: 67.4 kg (148 lb 9.4  oz) Most recent weight: Weight: 65.4 kg (144 lb 2.9 oz)       Palliative Assessment/Data:    Flowsheet Rows   Flowsheet Row Most Recent Value  Intake Tab  Referral Department  Hospitalist  Unit at Time of Referral  Med/Surg Unit  Palliative Care Primary Diagnosis  Neurology  Palliative Care Type  Return patient Palliative Care  Reason for referral  End of Life Care Assistance  Clinical Assessment  Palliative Performance Scale Score  10%  Pain Max last 24 hours  4  Pain Min Last 24 hours  3  Dyspnea Max Last 24 Hours  3  Dyspnea Min Last 24 hours  2  Nausea Max Last 24 Hours  4  Nausea Min Last 24 Hours  3  Anxiety Max Last 24 Hours  3  Anxiety Min Last 24 Hours  2  Psychosocial & Spiritual Assessment  Palliative Care Outcomes  Patient/Family meeting held?  Yes  Who was at the meeting?  son   Palliative Care Outcomes  Clarified goals of care      Patient Active Problem List   Diagnosis Date Noted  . Goals of care, counseling/discussion   . DNAR (do not attempt resuscitation) 10/22/2016  . Terminal care 10/22/2016  . UTI (urinary tract infection) 10/21/2016  . SAH (subarachnoid hemorrhage) (HCC) 10/20/2016  . Need for prophylactic vaccination and inoculation against influenza 06/08/2015  . CAD, multiple vessel -- status post CABG x3 (LIMA-LAD, SVG-PDA, SVG-OM*that is occluded) - stents in native   . Carotid artery disease -left   . Subclavian artery stenosis, left (HCC)   . Essential hypertension   . Hyperlipidemia LDL goal <70   . Right renal artery stenosis (HCC)   . Back pain 03/14/2013  . Peripheral vascular occlusive disease (HCC) 04/17/2012  . Atherosclerosis of native artery of extremity with intermittent claudication (HCC)  05/24/2011  . H/O unstable angina 09/29/2002    Palliative Care Assessment & Plan   Patient Profile:    Assessment:  SAH Mechanical fall CAD History of aortic stenosis Actively dying Increased secretions at end of life   Recommendations/Plan:   comfort measures  Titrate Fentanyl drip for comfort  PRN medications for secretions, Agree with Robinul, scopolamine patch  Prognosis hours to days  Family does not want patient moved to residential hospice.   Continue to support son, end of life signs and symptoms discussed.   Goals of Care and Additional Recommendations:  Limitations on Scope of Treatment: Full Comfort Care  Code Status:    Code Status Orders        Start     Ordered   10/20/16 [id number]  Do not attempt resuscitation (DNR)  Continuous    Question Answer Comment  In the event of cardiac or respiratory ARREST Do not call a "code blue"   In the event of cardiac or respiratory ARREST Do not perform Intubation, CPR, defibrillation or ACLS   In the event of cardiac or respiratory ARREST Use medication by any route, position, wound care, and other measures to relive pain and suffering. May use oxygen, suction and manual treatment of airway obstruction as needed for comfort.      10/20/16 [id number]    Code Status History    Date Active Date Inactive Code Status Order ID Comments User Context   10/20/2016  5:48 AM 10/20/2016  6:52 AM DNR [id number]  Kathlene Coteahul P Desai, PA-C ED   10/20/2016  3:24 AM 10/20/2016  5:48 AM Full Code [id number]  Bobette Mo, MD ED    Advance Directive Documentation   Flowsheet Row Most Recent Value  Type of Advance Directive  Living will  Pre-existing out of facility DNR order (yellow form or pink MOST form)  No data  "MOST" Form in Place?  No data       Prognosis:   Hours - Days  Discharge Planning:  Anticipated Hospital Death  Care plan was discussed with son   Thank you for allowing the Palliative Medicine Team to assist in  the care of this patient.   Time In:  9 Time Out: 9.25 Total Time 25 Prolonged Time Billed  no       Greater than 50%  of this time was spent counseling and coordinating care related to the above assessment and plan.  Rosalin Hawking, MD [phone] Please contact Palliative Medicine Team phone at [phone] for questions and concerns.

## 2016-10-24 NOTE — Care Management Important Message (Signed)
Important Message  Patient Details  Name: Betty Floyd MRN: [id number] Date of Birth: 18-Feb-1933   Medicare Important Message Given:  Yes    Pietra Zuluaga Stefan ChurchBratton 10/24/2016, 3:50 PM

## 2016-10-24 NOTE — Progress Notes (Signed)
Lyrica  ommitted based on clinical judgement, pt is on comfort care measures. Palliative care team please evaluate need for cortrak feeding tube

## 2016-10-25 DIAGNOSIS — S066X0A Traumatic subarachnoid hemorrhage without loss of consciousness, initial encounter: Secondary | ICD-10-CM

## 2016-10-25 MED ORDER — LORAZEPAM 2 MG/ML IJ SOLN: 0.5000 mg | INTRAMUSCULAR | Status: DC | PRN

## 2016-10-25 MED ORDER — FENTANYL CITRATE (PF) 100 MCG/2ML IJ SOLN
50.0000 ug | INTRAMUSCULAR | Status: DC | PRN
  Administered 2016-10-25 – 2016-10-26 (×4): 100 ug via INTRAVENOUS
  Filled 2016-10-25 (×4): qty 2

## 2016-10-25 MED ORDER — LORAZEPAM 2 MG/ML IJ SOLN
0.5000 mg | Freq: Two times a day (BID) | INTRAMUSCULAR | Status: DC
  Administered 2016-10-25 – 2016-10-27 (×4): 0.5 mg via INTRAVENOUS
  Filled 2016-10-25 (×4): qty 1

## 2016-10-25 MED ORDER — WHITE PETROLATUM GEL
Status: AC
  Administered 2016-10-25: 13:00:00
  Filled 2016-10-25: qty 1

## 2016-10-25 NOTE — Progress Notes (Signed)
Palliative Medicine RN Note: Afternoon visit by PMT team. Long discussion with pt's daughter and other family members. Discussed plan of care, medications, expected outcomes, s/s impending death. Discussed use of O2 at end of life as potentially prolonging death; family is not ready to d/t yet. Nurse and family report agitation/anxiety at shift changes. Family also reports worsening secretions with Robinul.   Discussed pt again with Dr Neale BurlyFreeman. He gave orders to adjust medications. Pt is less interactive than this afternoon. Respirations over 28 during my visit; RN Lillia AbedLindsay gave prn dose of fentanyl and will continue to monitor. Discussed with family s/s distress (moaning, grimacing, rapid breathing, strange breathing patterns, tense body); they verbalized understanding and will call RN if any of these is noted. Plan for Dr Neale BurlyFreeman to see later tonight and for PMT to follow up again tomorrow am.  Symptoms remain difficult to control, requiring frequent adjustments to her medication regimen by PMT.   Margret ChanceMelanie G. Virl Coble, RN, BSN, [facility] 10/25/2016 3:38 PM Cell [phone] 8:00-4:00 Monday-Friday Office [phone]

## 2016-10-25 NOTE — Progress Notes (Signed)
Daily Progress Note   Patient Name: Betty Floyd       Date: 10/25/2016 DOB: September 08, 1932  Age: 83 y.o. MRN#: [id number] Attending Physician: Leatha Gilding, MD Primary Care Physician: Martha Clan, MD Admit Date: 2016-11-06  Reason for Consultation/Follow-up: Terminal Care   Life limiting illness: brain bleed.   Subjective: Unresponsive Noisy breathing No distress noted Daughter at bedside, see below.    Length of Stay: 5  Current Medications: Scheduled Meds:  . atropine  2 drop Sublingual QID  . chlorhexidine  15 mL Mouth Rinse BID  . levETIRAcetam  500 mg Intravenous Q12H  . LORazepam  0.5 mg Intravenous BID  . mouth rinse  15 mL Mouth Rinse q12n4p  . scopolamine  1 patch Transdermal Q72H    Continuous Infusions: . sodium chloride 10 mL/hr at 10/25/16 1042  . fentaNYL infusion INTRAVENOUS 210 mcg/hr (10/25/16 1738)    PRN Meds: sodium chloride, fentaNYL (SUBLIMAZE) injection, hydrALAZINE, labetalol, LORazepam, ondansetron (ZOFRAN) IV  Physical Exam         Unresponsive Shallow gurgling respirations S1 S2 Abdomen distended No edema No coolness no mottling of extremities noted  Vital Signs: BP (!) 190/80 (BP Location: Left Arm)   Pulse (!) 113   Temp 99.8 F (37.7 C) (Axillary)   Resp 15   Ht 5\' 2"  (1.575 m)   Wt 65.4 kg (144 lb 2.9 oz)   SpO2 100%   BMI 26.37 kg/m  SpO2: SpO2: 100 % O2 Device: O2 Device: Nasal Cannula O2 Flow Rate: O2 Flow Rate (L/min): 5 L/min  Intake/output summary:   Intake/Output Summary (Last 24 hours) at 10/25/16 1857 Last data filed at 10/25/16 4098  Gross per 24 hour  Intake          1328.32 ml  Output              650 ml  Net           678.32 ml   LBM: Last BM Date: Nov 06, 2016 Baseline Weight: Weight: 67.4 kg (148 lb  9.4 oz) Most recent weight: Weight: 65.4 kg (144 lb 2.9 oz)       Palliative Assessment/Data:    Flowsheet Rows   Flowsheet Row Most Recent Value  Intake Tab  Referral Department  Hospitalist  Unit at Time of Referral  Med/Surg Unit  Palliative Care Primary Diagnosis  Neurology  Date Notified  10/22/16  Palliative Care Type  Return patient Palliative Care  Reason for referral  End of Life Care Assistance  Date of Admission  10/01/2016  Date first seen by Palliative Care  10/23/16  # of days Palliative referral response time  1 Day(s)  # of days IP prior to Palliative referral  3  Clinical Assessment  Palliative Performance Scale Score  10%  Pain Max last 24 hours  4  Pain Min Last 24 hours  3  Dyspnea Max Last 24 Hours  3  Dyspnea Min Last 24 hours  2  Nausea Max Last 24 Hours  4  Nausea Min Last 24 Hours  3  Anxiety Max Last 24 Hours  3  Anxiety Min Last 24 Hours  2  Psychosocial & Spiritual Assessment  Palliative Care Outcomes  Patient/Family meeting held?  Yes  Who was at the meeting?  son   Palliative Care Outcomes  Clarified goals of care      Patient Active Problem List   Diagnosis Date Noted  . Pressure injury of skin 10/24/2016  . Goals of care, counseling/discussion   . DNAR (do not attempt resuscitation) 10/22/2016  . Terminal care 10/22/2016  . UTI (urinary tract infection) 10/21/2016  . SAH (subarachnoid hemorrhage) (HCC) 10/20/2016  . Need for prophylactic vaccination and inoculation against influenza 06/08/2015  . CAD, multiple vessel -- status post CABG x3 (LIMA-LAD, SVG-PDA, SVG-OM*that is occluded) - stents in native   . Carotid artery disease -left   . Subclavian artery stenosis, left (HCC)   . Essential hypertension   . Hyperlipidemia LDL goal <70   . Right renal artery stenosis (HCC)   . Back pain 03/14/2013  . Peripheral vascular occlusive disease (HCC) 04/17/2012  . Atherosclerosis of native artery of extremity with intermittent claudication  (HCC) 05/24/2011  . H/O unstable angina 09/29/2002    Palliative Care Assessment & Plan   Patient Profile:    Assessment:  SAH Mechanical fall CAD History of aortic stenosis Actively dying Increased secretions at end of life   Recommendations/Plan:  Comfort measures  Titrate Fentanyl drip for comfort.  I discussed case multiple times thoughout day [facility] Henslee from our team and made adjustments as appropriate.  She appears comfortable on exam this evening.  Patient daughter reports that she has been having agitation at shift change in the evening.  Will try to premedicate with scheduled medication at [facility] to see if this alleviates this nighttime agitation.   PRN medications for secretions, Stopped Robinul as patient did not tolerate, scopolamine patch  Prognosis hours to days  Family does not want patient moved to residential hospice.   Continue to support family.  Discussed with daughter today, end of life signs and symptoms discussed.   Goals of Care and Additional Recommendations:  Limitations on Scope of Treatment: Full Comfort Care  Code Status:    Code Status Orders        Start     Ordered   10/20/16 [id number]  Do not attempt resuscitation (DNR)  Continuous    Question Answer Comment  In the event of cardiac or respiratory ARREST Do not call a "code blue"   In the event of cardiac or respiratory ARREST Do not perform Intubation, CPR, defibrillation or ACLS   In the event of cardiac or respiratory ARREST Use medication by any route, position, wound care, and other measures to relive pain and  suffering. May use oxygen, suction and manual treatment of airway obstruction as needed for comfort.      10/20/16 0981    Code Status History    Date Active Date Inactive Code Status Order ID Comments User Context   10/20/2016  5:48 AM 10/20/2016  6:52 AM DNR [id number]  Kathlene Cote, PA-C ED   10/20/2016  3:24 AM 10/20/2016  5:48 AM Full Code [id number]  Bobette Mo, MD ED    Advance Directive Documentation   Flowsheet Row Most Recent Value  Type of Advance Directive  Living will  Pre-existing out of facility DNR order (yellow form or pink MOST form)  No data  "MOST" Form in Place?  No data       Prognosis:   Hours - Days  Discharge Planning:  Anticipated Hospital Death  Care plan was discussed with Daughter  Thank you for allowing the Palliative Medicine Team to assist in the care of this patient.   Total Time 25 Prolonged Time Billed  no       Greater than 50%  of this time was spent counseling and coordinating care related to the above assessment and plan.  Romie Minus, MD [facility] Palliative Medicine Team [phone]   Please contact Palliative Medicine Team phone at [phone] for questions and concerns.

## 2016-10-25 NOTE — Progress Notes (Signed)
Patient ID: Betty Floyd, female   DOB: 1933/02/05, 83 y.o.   MRN: [id number] BP (!) 190/80 (BP Location: Left Arm)   Pulse (!) 113   Temp 99.8 F (37.7 C) (Axillary)   Resp 15   Ht 5\' 2"  (1.575 m)   Wt 65.4 kg (144 lb 2.9 oz)   SpO2 100%   BMI 26.37 kg/m  Comfort care, not responsive.  Family at bedside. All questions answered.

## 2016-10-25 NOTE — Progress Notes (Signed)
Palliative Medicine RN Note: Morning follow up by PMT. Pt required multiple adjustments to fentanyl infusion overnight but got no bolus doses. Pt still has NGT. Discussed NGT and O2 with son. He requests removal of NGT but is not ready to d/c O2; he will discuss this with his sister.   Called Dr Neale BurlyFreeman and obtained orders to adjust pt's medication and treatment regimen. Explained at length the fentanyl drip and the titration orders to son; he verbalized understanding. Discussed plan with RN Lillia AbedLindsay.  Plan f/u by PMT this afternoon, as pt's symptoms are not well controlled, and she requires frequent changes to her medications.  Margret ChanceMelanie G. Hassani Sliney, RN, BSN, [facility] 10/25/2016 10:19 AM Cell [phone] 8:00-4:00 Monday-Friday Office [phone]

## 2016-10-25 NOTE — Progress Notes (Signed)
12.5 ml of Fentanyl drip wasted in sink with Elease HashimotoKristie Beckner, RN.

## 2016-10-25 NOTE — Progress Notes (Signed)
Fentanyl drip increased to 20 ml/hr for comfort.

## 2016-10-25 NOTE — Progress Notes (Signed)
PROGRESS NOTE  Betty Floyd ZOX:[id number] DOB: 1932/12/28 DOA: 10/26/2016 PCP: Martha Clan, MD   LOS: 5 days   Brief Narrative: 83 y.o.femalewith PMH of Aortic Valve Stenosis, CAD (Home Plavix), HTN, Previous MI, who was admitted on ICU on 2/22 with subarachnoid hemorrhage in the setting of a mechanical fall.  Neurosurgery was consulted.  She underwent repeat CT scans which showed that her subarachnoid hemorrhage is increasing in size.  She remains obtunded.  Decision was made to transition patient to comfort care and she was transferred to the floor on 2/24, and TRH took over 2/25.  Assessment & Plan: Principal Problem:   SAH (subarachnoid hemorrhage) (HCC) Active Problems:   CAD, multiple vessel -- status post CABG x3 (LIMA-LAD, SVG-PDA, SVG-OM*that is occluded) - stents in native   DNAR (do not attempt resuscitation)   Terminal care   Goals of care, counseling/discussion   Pressure injury of skin  Goals of care -Patient admitted on 2/22 with a subarachnoid hemorrhage in the setting of ground level fall.  Repeat CT scan on 2/23 showed worsening of her hemorrhage.  Neurosurgery was consulted and they did not recommend surgery due to comorbidities, and not consistent with goals of care per discussion between neurosurgery and family. -She was transferred to regular floor on 2/24, with focus of care towards comfort rather than aggressive measures. -I discussed with patient's son at bedside 2/25 and 2/27.  I doubt she will have any meaningful recovery. -Anticipate in-hospital death  -Mental status worse today, she is unresponsive.  Appreciate palliative care follow-up  Subarachnoid hemorrhage -Worsening post most recent CT scan, not a surgical candidate per neurosurgery. -She was on Plavix, aspirin and Pletal prior to admission, now on hold  Coronary artery disease s/p CABG -Comfort Care approach currently  Peripheral vascular disease -Cannot use any antiplatelets currently.   Comfort approach.  Hyperlipidemia -Unable to take p.o., discontinue statin   DVT prophylaxis: Comfort care Code Status: DNR Family Communication: Discussed with son at bedside Disposition Plan: Anticipate in hospital death  Consultants:   PCCM  Neurosurgery   Palliative  Procedures:   None   Antimicrobials:  None    Subjective: -Unresponsive, agonal breathing  Objective: Vitals:   10/24/16 2208 10/25/16 0221 10/25/16 0315 10/25/16 0621  BP: (!) 185/62 (!) 186/66 (!) 158/64 (!) 155/57  Pulse: (!) 131 (!) 122 (!) 102 (!) 117  Resp: 15 16  15   Temp: 98.6 F (37 C)   98.2 F (36.8 C)  TempSrc: Axillary   Oral  SpO2: 99% 96%  100%  Weight:      Height:        Intake/Output Summary (Last 24 hours) at 10/25/16 1039 Last data filed at 10/25/16 0609  Gross per 24 hour  Intake          1328.32 ml  Output              650 ml  Net           678.32 ml   Filed Weights   10/20/16 0800 10/21/16 0400 10/22/16 0500  Weight: 67.4 kg (148 lb 9.4 oz) 67.1 kg (147 lb 14.9 oz) 65.4 kg (144 lb 2.9 oz)    Examination: Constitutional: Unresponsive Vitals:   10/24/16 2208 10/25/16 0221 10/25/16 0315 10/25/16 0621  BP: (!) 185/62 (!) 186/66 (!) 158/64 (!) 155/57  Pulse: (!) 131 (!) 122 (!) 102 (!) 117  Resp: 15 16  15   Temp: 98.6 F (37 C)   98.2  F (36.8 C)  TempSrc: Axillary   Oral  SpO2: 99% 96%  100%  Weight:      Height:        Data Reviewed: I have personally reviewed following labs and imaging studies  CBC:  Recent Labs Lab 10/20/16 0037 10/21/16 0329 10/22/16 0546 10/23/16 0519  WBC 4.1 9.2 12.4* 12.7*  NEUTROABS 2.8  --   --   --   HGB 12.1 11.2* 11.6* 11.4*  HCT 38.2 34.8* 36.2 36.1  MCV 92.3 90.4 91.9 92.6  PLT 129* 127* 157 189   Basic Metabolic Panel:  Recent Labs Lab 10/20/16 0037  10/21/16 0329 10/21/16 1157 10/21/16 1636 10/22/16 0546 10/22/16 1701 10/23/16 0519  NA 140  --  138  --   --  139  --  140  K 4.0  --  3.7  --   --   4.2  --  3.8  CL 102  --  106  --   --  104  --  104  CO2 28  --  24  --   --  25  --  22  GLUCOSE 115*  --  157*  --   --  118*  --  117*  BUN 17  --  14  --   --  20  --  18  CREATININE 1.09*  --  0.78  --   --  0.81  --  0.63  CALCIUM 9.5  --  9.1  --   --  9.4  --  9.6  MG  --   < > 1.5* 1.5* 2.3 2.0 1.9 1.8  PHOS  --   < > 2.5 2.1* 1.7* 2.3* 2.1* 2.4*  < > = values in this interval not displayed. GFR: Estimated Creatinine Clearance: 48.9 mL/min (by C-G formula based on SCr of 0.63 mg/dL). Liver Function Tests: No results for input(s): AST, ALT, ALKPHOS, BILITOT, PROT, ALBUMIN in the last 168 hours. No results for input(s): LIPASE, AMYLASE in the last 168 hours. No results for input(s): AMMONIA in the last 168 hours. Coagulation Profile:  Recent Labs Lab 10/20/16 0037  INR 1.03   Cardiac Enzymes: No results for input(s): CKTOTAL, CKMB, CKMBINDEX, TROPONINI in the last 168 hours. BNP (last 3 results) No results for input(s): PROBNP in the last 8760 hours. HbA1C: No results for input(s): HGBA1C in the last 72 hours. CBG:  Recent Labs Lab 10/21/16 2343 10/22/16 0445 10/22/16 0752 10/22/16 1124 10/22/16 1524  GLUCAP 117* 121* 115* 152* 131*   Lipid Profile: No results for input(s): CHOL, HDL, LDLCALC, TRIG, CHOLHDL, LDLDIRECT in the last 72 hours. Thyroid Function Tests: No results for input(s): TSH, T4TOTAL, FREET4, T3FREE, THYROIDAB in the last 72 hours. Anemia Panel: No results for input(s): VITAMINB12, FOLATE, FERRITIN, TIBC, IRON, RETICCTPCT in the last 72 hours. Urine analysis:    Component Value Date/Time   COLORURINE YELLOW 08/14/2009 1601   APPEARANCEUR CLOUDY (A) 08/14/2009 1601   LABSPEC 1.030 08/14/2009 1601   PHURINE 6.5 08/14/2009 1601   GLUCOSEU NEGATIVE 08/14/2009 1601   HGBUR LARGE (A) 08/14/2009 1601   BILIRUBINUR NEGATIVE 08/14/2009 1601   KETONESUR NEGATIVE 08/14/2009 1601   PROTEINUR NEGATIVE 08/14/2009 1601   UROBILINOGEN 1.0 08/14/2009  1601   NITRITE NEGATIVE 08/14/2009 1601   LEUKOCYTESUR NEGATIVE 08/14/2009 1601   Sepsis Labs: Invalid input(s): PROCALCITONIN, LACTICIDVEN  Recent Results (from the past 240 hour(s))  MRSA PCR Screening     Status: None   Collection Time: 10/20/16  6:15 AM  Result Value Ref Range Status   MRSA by PCR NEGATIVE NEGATIVE Final    Comment:        The GeneXpert MRSA Assay (FDA approved for NASAL specimens only), is one component of a comprehensive MRSA colonization surveillance program. It is not intended to diagnose MRSA infection nor to guide or monitor treatment for MRSA infections.       Radiology Studies: No results found.   Scheduled Meds: . atropine  2 drop Sublingual QID  . chlorhexidine  15 mL Mouth Rinse BID  . levETIRAcetam  500 mg Intravenous Q12H  . mouth rinse  15 mL Mouth Rinse q12n4p  . pantoprazole (PROTONIX) IV  40 mg Intravenous Q24H  . scopolamine  1 patch Transdermal Q72H  . white petrolatum       Continuous Infusions: . sodium chloride 20 mL/hr at 10/24/16 2057  . fentaNYL infusion INTRAVENOUS 210 mcg/hr (10/25/16 [id number])    Pamella Pertostin Gherghe, MD, PhD [facility] Pager [phone]  If 7PM-7AM, please contact night-coverage [email or web address] Password [email or web address] 10/25/2016, 10:39 AM

## 2016-10-26 DIAGNOSIS — S066X0D Traumatic subarachnoid hemorrhage without loss of consciousness, subsequent encounter: Secondary | ICD-10-CM

## 2016-10-26 DIAGNOSIS — Z7189 Other specified counseling: Secondary | ICD-10-CM

## 2016-10-26 DIAGNOSIS — R0600 Dyspnea, unspecified: Secondary | ICD-10-CM

## 2016-10-26 DIAGNOSIS — R0603 Acute respiratory distress: Secondary | ICD-10-CM

## 2016-10-26 DIAGNOSIS — K117 Disturbances of salivary secretion: Secondary | ICD-10-CM

## 2016-10-26 MED ORDER — HYDROMORPHONE BOLUS VIA INFUSION
2.0000 mg | INTRAVENOUS | Status: DC | PRN
  Administered 2016-10-26 – 2016-10-27 (×11): 2 mg via INTRAVENOUS
  Filled 2016-10-26: qty 2

## 2016-10-26 MED ORDER — HYDROMORPHONE HCL 2 MG/ML IJ SOLN
2.0000 mg | Freq: Once | INTRAMUSCULAR | Status: AC
  Administered 2016-10-26: 2 mg via INTRAVENOUS
  Filled 2016-10-26: qty 1

## 2016-10-26 MED ORDER — GLYCOPYRROLATE 0.2 MG/ML IJ SOLN
0.2000 mg | Freq: Once | INTRAMUSCULAR | Status: AC
  Administered 2016-10-26: 0.2 mg via INTRAVENOUS
  Filled 2016-10-26: qty 1

## 2016-10-26 MED ORDER — SODIUM CHLORIDE 0.9 % IV SOLN
2.0000 mg/h | INTRAVENOUS | Status: DC
  Administered 2016-10-26 – 2016-10-27 (×2): 2 mg/h via INTRAVENOUS
  Filled 2016-10-26 (×2): qty 5

## 2016-10-26 MED ORDER — ATROPINE SULFATE 1 % OP SOLN
2.0000 [drp] | OPHTHALMIC | Status: DC
  Administered 2016-10-26 – 2016-10-27 (×8): 2 [drp] via SUBLINGUAL
  Filled 2016-10-26: qty 5

## 2016-10-26 MED ORDER — HYDROMORPHONE HCL 2 MG/ML IJ SOLN
3.0000 mg | INTRAMUSCULAR | Status: AC
  Administered 2016-10-26: 3 mg via INTRAVENOUS
  Filled 2016-10-26: qty 2

## 2016-10-26 NOTE — Progress Notes (Signed)
Palliative Medicine RN Note: Saw pt for afternoon check. RR over 40; copious secretions despite prn atropine, positioning, and attempted suctioning. Pt has had fentanyl prn doses today, and continuous rate was increased. Called Dr Neale BurlyFreeman to come to bedside and see pt.  He spoke with family. Discussed use of fentanyl and atropine. Plan made to give hydromorphone IV stat pending transition to continuous dilaudid transfusion. Scheduled atropine. Discussed with RN Lillia AbedLindsay.   1450 Returned to floor. Pt has gotten 2 IV doses of hydromorphone as ordered; still waiting on bag for infusion. Pt appears more relaxed, but RR still around 30. However, most recent dose was in last 5-10 minutes, so I will stay on the floor to continue monitoring.  1545 Dr Neale BurlyFreeman on the unite. Gave orders for breakthrough dilaudid prn. He is visiting with pt/daughter and will be available by phone for the next few hours. Plan f/u tomorrow am by PMT.   Margret ChanceMelanie G. Zidan Helget, RN, BSN, [facility] 10/26/2016 2:51 PM Cell [phone] 8:00-4:00 Monday-Friday Office [phone]

## 2016-10-26 NOTE — Progress Notes (Signed)
100ml of Fentanyl IV wasted in sink with Clearnce Sorrelarrie Baker, RN.  Hector ShadeMoss, Pennelope Basque CrossnoreLindsay

## 2016-10-26 NOTE — Progress Notes (Signed)
PROGRESS NOTE  Betty Floyd ZOX:[id number] DOB: 05/12/1933 DOA: 10/21/2016 PCP: Martha Clan, MD   LOS: 6 days   Subjective: -Unresponsive, agonal breathing, appears to be having some secretions in the back of her throat, I used to wall suction to clear it.  Brief Narrative: 83 y.o.femalewith PMH of Aortic Valve Stenosis, CAD (Home Plavix), HTN, Previous MI, who was admitted on ICU on 2/22 with subarachnoid hemorrhage in the setting of a mechanical fall.  Neurosurgery was consulted.  She underwent repeat CT scans which showed that her subarachnoid hemorrhage is increasing in size.  She remains obtunded.  Decision was made to transition patient to comfort care and she was transferred to the floor on 2/24, and TRH took over 2/25.  Assessment & Plan: Principal Problem:   SAH (subarachnoid hemorrhage) (HCC) Active Problems:   CAD, multiple vessel -- status post CABG x3 (LIMA-LAD, SVG-PDA, SVG-OM*that is occluded) - stents in native   DNAR (do not attempt resuscitation)   Terminal care   Goals of care, counseling/discussion   Pressure injury of skin   Subarachnoid hemorrhage following injury, no loss of consciousness (HCC)  Goals of care -Patient admitted on 2/22 with a subarachnoid hemorrhage in the setting of ground level fall.  Repeat CT scan on 2/23 showed worsening of her hemorrhage.  Neurosurgery was consulted and they did not recommend surgery due to comorbidities, and not consistent with goals of care per discussion between neurosurgery and family. -She was transferred to regular floor on 2/24, with focus of care towards comfort rather than aggressive measures. -I discussed with patient's son at bedside 2/25 and 2/27.  I doubt she will have any meaningful recovery. -Anticipate in-hospital death  -This not respond to painful or verbal stimuli.  Subarachnoid hemorrhage -Worsening post most recent CT scan, not a surgical candidate per neurosurgery. -She was on Plavix, aspirin  and Pletal prior to admission, now on hold  Coronary artery disease s/p CABG -Comfort Care approach currently  Peripheral vascular disease -Cannot use any antiplatelets currently.  Comfort approach.  Hyperlipidemia -Unable to take p.o., discontinue statin   DVT prophylaxis: Comfort care Code Status: DNR Family Communication: Discussed with son at bedside Disposition Plan: Anticipate in hospital death  Consultants:   PCCM  Neurosurgery   Palliative  Procedures:   None   Antimicrobials:  None     Objective: Vitals:   10/25/16 1300 10/25/16 1927 10/25/16 2221 10/26/16 0614  BP: (!) 190/80 (!) 162/58 (!) 182/72 (!) 144/55  Pulse: (!) 113 (!) 114 (!) 121 (!) 122  Resp:  18 17 16   Temp: 99.8 F (37.7 C) 99.7 F (37.6 C) 99.3 F (37.4 C) 98.9 F (37.2 C)  TempSrc: Axillary Oral Axillary Axillary  SpO2: 100% 100% 99% 100%  Weight:      Height:        Intake/Output Summary (Last 24 hours) at 10/26/16 1140 Last data filed at 10/26/16 0620  Gross per 24 hour  Intake           931.33 ml  Output              700 ml  Net           231.33 ml   Filed Weights   10/20/16 0800 10/21/16 0400 10/22/16 0500  Weight: 67.4 kg (148 lb 9.4 oz) 67.1 kg (147 lb 14.9 oz) 65.4 kg (144 lb 2.9 oz)    Examination: Constitutional: Unresponsive Vitals:   10/25/16 1300 10/25/16 1927 10/25/16 2221 10/26/16 8119  BP: (!) 190/80 (!) 162/58 (!) 182/72 (!) 144/55  Pulse: (!) 113 (!) 114 (!) 121 (!) 122  Resp:  18 17 16   Temp: 99.8 F (37.7 C) 99.7 F (37.6 C) 99.3 F (37.4 C) 98.9 F (37.2 C)  TempSrc: Axillary Oral Axillary Axillary  SpO2: 100% 100% 99% 100%  Weight:      Height:        Data Reviewed: I have personally reviewed following labs and imaging studies  CBC:  Recent Labs Lab 10/20/16 0037 10/21/16 0329 10/22/16 0546 10/23/16 0519  WBC 4.1 9.2 12.4* 12.7*  NEUTROABS 2.8  --   --   --   HGB 12.1 11.2* 11.6* 11.4*  HCT 38.2 34.8* 36.2 36.1  MCV 92.3 90.4  91.9 92.6  PLT 129* 127* 157 189   Basic Metabolic Panel:  Recent Labs Lab 10/20/16 0037  10/21/16 0329 10/21/16 1157 10/21/16 1636 10/22/16 0546 10/22/16 1701 10/23/16 0519  NA 140  --  138  --   --  139  --  140  K 4.0  --  3.7  --   --  4.2  --  3.8  CL 102  --  106  --   --  104  --  104  CO2 28  --  24  --   --  25  --  22  GLUCOSE 115*  --  157*  --   --  118*  --  117*  BUN 17  --  14  --   --  20  --  18  CREATININE 1.09*  --  0.78  --   --  0.81  --  0.63  CALCIUM 9.5  --  9.1  --   --  9.4  --  9.6  MG  --   < > 1.5* 1.5* 2.3 2.0 1.9 1.8  PHOS  --   < > 2.5 2.1* 1.7* 2.3* 2.1* 2.4*  < > = values in this interval not displayed. GFR: Estimated Creatinine Clearance: 48.9 mL/min (by C-G formula based on SCr of 0.63 mg/dL). Liver Function Tests: No results for input(s): AST, ALT, ALKPHOS, BILITOT, PROT, ALBUMIN in the last 168 hours. No results for input(s): LIPASE, AMYLASE in the last 168 hours. No results for input(s): AMMONIA in the last 168 hours. Coagulation Profile:  Recent Labs Lab 10/20/16 0037  INR 1.03   Cardiac Enzymes: No results for input(s): CKTOTAL, CKMB, CKMBINDEX, TROPONINI in the last 168 hours. BNP (last 3 results) No results for input(s): PROBNP in the last 8760 hours. HbA1C: No results for input(s): HGBA1C in the last 72 hours. CBG:  Recent Labs Lab 10/21/16 2343 10/22/16 0445 10/22/16 0752 10/22/16 1124 10/22/16 1524  GLUCAP 117* 121* 115* 152* 131*   Lipid Profile: No results for input(s): CHOL, HDL, LDLCALC, TRIG, CHOLHDL, LDLDIRECT in the last 72 hours. Thyroid Function Tests: No results for input(s): TSH, T4TOTAL, FREET4, T3FREE, THYROIDAB in the last 72 hours. Anemia Panel: No results for input(s): VITAMINB12, FOLATE, FERRITIN, TIBC, IRON, RETICCTPCT in the last 72 hours. Urine analysis:    Component Value Date/Time   COLORURINE YELLOW 08/14/2009 1601   APPEARANCEUR CLOUDY (A) 08/14/2009 1601   LABSPEC 1.030 08/14/2009  1601   PHURINE 6.5 08/14/2009 1601   GLUCOSEU NEGATIVE 08/14/2009 1601   HGBUR LARGE (A) 08/14/2009 1601   BILIRUBINUR NEGATIVE 08/14/2009 1601   KETONESUR NEGATIVE 08/14/2009 1601   PROTEINUR NEGATIVE 08/14/2009 1601   UROBILINOGEN 1.0 08/14/2009 1601   NITRITE  NEGATIVE 08/14/2009 1601   LEUKOCYTESUR NEGATIVE 08/14/2009 1601   Sepsis Labs: Invalid input(s): PROCALCITONIN, LACTICIDVEN  Recent Results (from the past 240 hour(s))  MRSA PCR Screening     Status: None   Collection Time: 10/20/16  6:15 AM  Result Value Ref Range Status   MRSA by PCR NEGATIVE NEGATIVE Final    Comment:        The GeneXpert MRSA Assay (FDA approved for NASAL specimens only), is one component of a comprehensive MRSA colonization surveillance program. It is not intended to diagnose MRSA infection nor to guide or monitor treatment for MRSA infections.       Radiology Studies: No results found.   Scheduled Meds: . atropine  2 drop Sublingual QID  . chlorhexidine  15 mL Mouth Rinse BID  . levETIRAcetam  500 mg Intravenous Q12H  . LORazepam  0.5 mg Intravenous BID  . mouth rinse  15 mL Mouth Rinse q12n4p  . scopolamine  1 patch Transdermal Q72H   Continuous Infusions: . sodium chloride 10 mL/hr at 10/25/16 1042  . fentaNYL infusion INTRAVENOUS 275 mcg/hr (10/26/16 1010)    Clydia Llano A, MD [facility] Pager [phone]  If 7PM-7AM, please contact night-coverage [email or web address] Password [email or web address] 10/26/2016, 11:40 AM

## 2016-10-26 NOTE — Progress Notes (Signed)
Nutrition Brief Note  Chart reviewed. Pt now transitioning to comfort care.  No further nutrition interventions warranted at this time.  Please re-consult as needed.   Kenisha Lynds A. Shavon Zenz, RD, LDN, CDE Pager: [phone] After hours Pager: [phone]  

## 2016-10-26 NOTE — Progress Notes (Addendum)
Palliative care progress note  Reason for visit: Terminal care with symptom management including shortness of breath, increased secretions, and restlessness  I met with family at the bedside on multiple occasions throughout the day, both alone and in conjunction with [facility] of our team, for management of worsening symptoms.  PMT RN, Ms. Cherylann Ratel today reporting that Ms. Stambaugh was having increased symptom burden including increased work of breathing with respiratory rate greater than 40 as well as increase in the amount of secretions despite scopolamine patch and as needed atropine.  I presented the floor and examined Ms. Schiro in conjunction with palliative medicine RN.  She displayed increased work of breathing despite fentanyl infusion as well as copious secretions. Due to increased symptom burden despite titration of fentanyl, Ms. Langham was rotated to Dilaudid infusion with continuation of additional Dilaudid when necessary as needed. We attempted to reposition and suction Ms. Abbett. Also delivered additional dose of Robinul to see if this is beneficial (family reports they feel that this worsened the problem when she had a dose yesterday).  On two subsequent encounters for follow-up, she did have much improved work of breathing with respiratory rate in the mid teens and appeared much more comfortable overall.  Unfortunately, she still displays increased amount of secretion which her family finds very distressing. We discussed this at length including the fact that this is often a natural part of the dying process and should not be confused with a sign of suffering due to the fact that she is otherwise comfortable with no noted increased work of breathing, agitation, or grimaces noted on examination.  I answered all of her family's questions to the best of my ability and provided a great deal of anticipatory counseling on what to expect moving forward.  I spoke again with her bedside  RN regarding continued management secretions as much as possible including positioning, gentle suction, and anti-secratory agents.  Ms. Bomba continues to progress quickly in the process of dying. I do not believe that she is stable enough to consider transition from the hospital at this time. I anticipate a hospital death.  Face to face time:  1330-1420  (50 minutes)    1540-1610  (30 minutes)    1800-1815  (15 minutes)  Total time: 90 minutes Greater than 50%  of this time was spent counseling and coordinating care related to the above assessment and plan.  Micheline Rough, MD [facility] [phone]

## 2016-10-26 NOTE — Progress Notes (Addendum)
Palliative Medicine RN Note: AM check on pt. Audible secretions. Unable to suction them. Repositioned/sat her up with help of 2 nurse techs. Pt breathing 28-30 times per minute and labored; Lillia AbedLindsay will give bolus of fentanyl. Pt did not require bolus or increase in fentanyl overnight per chart. Per RN family decided this am to d/c O2, so it was removed, and pt is now on RA.  No family at bedside. Plan follow up this afternoon by PMT member. Family and nurse have contact information.  Margret ChanceMelanie G. Katilin Raynes, RN, BSN, [facility] 10/26/2016 9:05 AM Cell [phone] 8:00-4:00 Monday-Friday Office [phone]

## 2016-10-27 DEATH — deceased

## 2016-11-27 NOTE — Progress Notes (Signed)
Palliative Medicine RN Note: Notified by Dr Neale BurlyFreeman that pt expired. Came to room to provide support to family. Son at bedside; daughter arrived later. Chaplain Ray also came to visit and provide prayer and support. Discussed next steps of family staying as long as they like then notifying ME and funeral home of pt's demise. Family decided they were ready to leave; I gave them pt's dentures and glasses to bring home so they can give them directly to [facility] [place].   Alona BeneJoyce, RN, will call [facility].  I called ME Al Decantick Miller at [phone]. Pt will be an ME case. He requested RN remove foley and dispose of medication bags but leave IV access in place. Pt will be moved to the morgue. Notified RN Clayborne DanaPatti. Called pt's daughter to let her know that pt will be ME case; she verbalized appreciation of notification.   Margret ChanceMelanie G. Shawana Knoch, RN, BSN, [facility] 11/13/2016 10:36 AM Cell [phone] 8:00-4:00 Monday-Friday Office [phone]

## 2016-11-27 NOTE — Progress Notes (Signed)
Sent to morgue with IV intact and foley in place.

## 2016-11-27 NOTE — Progress Notes (Signed)
[facility] notified of patient's death. Spoke with April Shore. Results - Patient suitable for tissue only. Place hold for [facility].

## 2016-11-27 NOTE — Discharge Summary (Signed)
Death Summary  Betty Floyd WGN:[id number] DOB: [birth year] DOA: Oct 31, 2016  PCP: Martha Clan, MD  Admit date: 31-Oct-2016 Date of Death: 11/08/16 Time of Death: 54 Hrs Notification: Martha Clan, MD notified of death of 08-Nov-2016   History of present illness:  Betty Floyd is a 83 y.o. female with a history of Aortic valve stenosis, CAD on Plavix at home, HTN and previous MI. Patient was admitted to the ICU on 10/22 after a mechanical fall and developed subarachnoid hemorrhage. Neurosurgery was consulted she underwent CT scan showed initial hemorrhage, repeat CT scan on the 2/23 showed the subarachnoid hemorrhage is increasing in size with midline shift. Patient remained obtunded, neurosurgery recommended comfort measures. Palliative care was consulted, after discussion with family patient goals of care change to full comfort, patient is DNR/DNI. Transfer from the ICU to 6 N. She expired on the morning of 2016-11-08 at 8:45 AM.  Final Diagnoses:  1.   Subdural hematoma with midline shift   The results of significant diagnostics from this hospitalization (including imaging, microbiology, ancillary and laboratory) are listed below for reference.    Significant Diagnostic Studies: Dg Lumbar Spine Complete  Result Date: 10/20/2016 CLINICAL DATA:  Fall with low back pain EXAM: LUMBAR SPINE - COMPLETE 4+ VIEW COMPARISON:  CT 04/10/2012 FINDINGS: Surgical clips in the right upper quadrant. SI joints are symmetric. Surgical clips in the retroperitoneum and groins. Vascular stents overlie L1 and L2. Lumbar alignment within normal limits. Vertebral body are grossly maintained. Moderate severe narrowing at L4-L5 and L5-S1 with endplate changes and osteophyte. IMPRESSION: 1. Degenerative changes.  No definite acute osseous abnormality. 2. Atherosclerotic vascular calcifications. Electronically Signed   By: Jasmine Pang M.D.   On: 10/20/2016 00:20   Ct Head Wo Contrast  Result Date:  10/21/2016 CLINICAL DATA:  83 y/o F; changes and vital signs with subarachnoid hemorrhage yesterday. EXAM: CT HEAD WITHOUT CONTRAST TECHNIQUE: Contiguous axial images were obtained from the base of the skull through the vertex without intravenous contrast. COMPARISON:  10/20/2016 CT of the head. FINDINGS: Brain: Left paramedian frontal hematoma with parenchymal and subarachnoid components has markedly increased in size measuring 6.3 x 3.3 x 6.1 cm (AP x ML x CC series 602, image 35 and series 4, image 25). There has been interval decompression of hemorrhage into the ventricular system with pooling in occipital horns and there is a increase in subarachnoid hemorrhage with distribution over the cerebral convexities bilaterally. Increased mass effect with partial effacement of right and effacement of left frontal horns of lateral ventricles. No hydrocephalus. Left to right of midline shift of 4 mm. Vascular: Calcific atherosclerosis of the cavernous and paraclinoid internal carotid arteries. Skull: Scalp thickening at the parietal convexity compatible with contusion. Scalp skin staples overlying contusion. No displaced calvarial fracture. Sinuses/Orbits: No acute finding. Other: None. IMPRESSION: 1. Marked increase in size of left paramedian frontal parenchymal and subarachnoid hemorrhage. Interval decompression of hemorrhage into the lateral ventricles with blood in occipital horns and third ventricle. Increased subarachnoid hemorrhage distributed over the convexities. 2. Worsening mass effect with 4 mm left-to-right midline shift and effacement of frontal horns of lateral ventricles. 3. No hydrocephalus at this time. These results will be called to the ordering clinician or representative by the Radiologist Assistant, and communication documented in the PACS or zVision Dashboard. Electronically Signed   By: Mitzi Hansen M.D.   On: 10/21/2016 19:46   Ct Head Wo Contrast  Result Date:  10/20/2016 CLINICAL DATA:  Subarachnoid hemorrhage EXAM: CT  HEAD WITHOUT CONTRAST TECHNIQUE: Contiguous axial images were obtained from the base of the skull through the vertex without intravenous contrast. COMPARISON:  Head CT 10/20/2016 at 12:01 a.m. FINDINGS: Brain: Left frontal subarachnoid hemorrhage has increased from the prior examination. The area of hemorrhage now measures up to 4.5 cm in AP dimension. There may be an associated parenchymal component. There is increased mass effect on the frontal horn of left lateral ventricle. There is increase rightward bulging of the cerebral falx. No midline shift. No hydrocephalus. Vascular: No hyperdense vessel or unexpected calcification. Skull: Laceration and subgaleal hematoma of the left parietal scalp is again seen. No focal calvarial abnormality. Sinuses/Orbits: Paranasal sinuses and mastoids are free of fluid. Normal orbits. Other: None. IMPRESSION: 1. Increasing size of subarachnoid hematoma overlying the left frontal lobe. An associated intraparenchymal component would be difficult to exclude. 2. Increased mass effect on the frontal horn of the left lateral ventricle, but no hydrocephalus or midline shift. These results will be called to the ordering clinician or representative by the Radiologist Assistant, and communication documented in the PACS or zVision Dashboard. Electronically Signed   By: Deatra Robinson M.D.   On: 10/20/2016 03:50   Ct Head Wo Contrast  Result Date: 10/20/2016 CLINICAL DATA:  Status post fall. Hit back of head on floor. Patient on blood thinners. Initial encounter. EXAM: CT HEAD WITHOUT CONTRAST TECHNIQUE: Contiguous axial images were obtained from the base of the skull through the vertex without intravenous contrast. COMPARISON:  CT of the head performed 08/14/2009, and MRI of the brain from 08/16/2009 FINDINGS: Brain: Focal acute subarachnoid hemorrhage is noted along the medial left frontal lobe, with trace blood tracking along  the left anterior falx cerebri. No evidence of acute infarction, hydrocephalus, extra-axial collection or mass lesion. Prominence of the ventricles and sulci reflects mild cortical volume loss. Mild cerebellar atrophy is noted. Scattered periventricular and subcortical white matter change likely reflects small vessel ischemic microangiopathy. The brainstem and fourth ventricle are within normal limits. The basal ganglia are unremarkable in appearance. No midline shift is seen. Vascular: No hyperdense vessel or unexpected calcification. Skull: There is no evidence of fracture; visualized osseous structures are unremarkable in appearance. Sinuses/Orbits: The orbits are within normal limits. The paranasal sinuses and mastoid air cells are well-aerated. Other: Soft tissue swelling is noted at the posterior vertex. IMPRESSION: 1. Focal acute subarachnoid hemorrhage along the medial left frontal lobe, with trace blood tracking along the left anterior falx cerebri. 2. Soft tissue swelling at the posterior vertex. 3. Mild cortical volume loss and scattered small vessel ischemic microangiopathy. Critical Value/emergent results were called by telephone at the time of interpretation on 10/20/2016 at 12:34 am to Dr. Jaci Carrel, who verbally acknowledged these results. Electronically Signed   By: Roanna Raider M.D.   On: 10/20/2016 00:35   Dg Chest Port 1 View  Result Date: 10/20/2016 CLINICAL DATA:  Pain following fall EXAM: PORTABLE CHEST 1 VIEW COMPARISON:  March 14, 2013 FINDINGS: There is slight left base atelectasis. Lungs elsewhere clear. Heart is mildly enlarged with pulmonary vascularity within normal limits. There is evidence of prior coronary artery bypass grafting. There is also surgery in the left paratracheal and neck regions. There is aortic atherosclerosis. No pneumothorax. No bone lesions. No evident adenopathy. IMPRESSION: Slight left base atelectasis. Lungs elsewhere clear. Stable cardiomegaly.  There is aortic atherosclerosis. No pneumothorax. Electronically Signed   By: Bretta Bang III M.D.   On: 10/20/2016 07:35   Dg Abd Portable 1v  Result Date: 10/21/2016 CLINICAL DATA:  NG tube placement EXAM: PORTABLE ABDOMEN - 1 VIEW COMPARISON:  None. FINDINGS: Feeding tube enters the stomach with the tip in the right upper quadrant, most likely in the duodenum bulb region. Normal bowel gas pattern. IMPRESSION: Feeding tube tip in the region of the duodenum bulb or gastric antrum. Electronically Signed   By: Marlan Palauharles  Clark M.D.   On: 10/21/2016 13:47    Microbiology: Recent Results (from the past 240 hour(s))  MRSA PCR Screening     Status: None   Collection Time: 10/20/16  6:15 AM  Result Value Ref Range Status   MRSA by PCR NEGATIVE NEGATIVE Final    Comment:        The GeneXpert MRSA Assay (FDA approved for NASAL specimens only), is one component of a comprehensive MRSA colonization surveillance program. It is not intended to diagnose MRSA infection nor to guide or monitor treatment for MRSA infections.      Labs: Basic Metabolic Panel:  Recent Labs Lab 10/21/16 0329 10/21/16 1157 10/21/16 1636 10/22/16 0546 10/22/16 1701 10/23/16 0519  NA 138  --   --  139  --  140  K 3.7  --   --  4.2  --  3.8  CL 106  --   --  104  --  104  CO2 24  --   --  25  --  22  GLUCOSE 157*  --   --  118*  --  117*  BUN 14  --   --  20  --  18  CREATININE 0.78  --   --  0.81  --  0.63  CALCIUM 9.1  --   --  9.4  --  9.6  MG 1.5* 1.5* 2.3 2.0 1.9 1.8  PHOS 2.5 2.1* 1.7* 2.3* 2.1* 2.4*   Liver Function Tests: No results for input(s): AST, ALT, ALKPHOS, BILITOT, PROT, ALBUMIN in the last 168 hours. No results for input(s): LIPASE, AMYLASE in the last 168 hours. No results for input(s): AMMONIA in the last 168 hours. CBC:  Recent Labs Lab 10/21/16 0329 10/22/16 0546 10/23/16 0519  WBC 9.2 12.4* 12.7*  HGB 11.2* 11.6* 11.4*  HCT 34.8* 36.2 36.1  MCV 90.4 91.9 92.6  PLT  127* 157 189   Cardiac Enzymes: No results for input(s): CKTOTAL, CKMB, CKMBINDEX, TROPONINI in the last 168 hours. D-Dimer No results for input(s): DDIMER in the last 72 hours. BNP: Invalid input(s): POCBNP CBG:  Recent Labs Lab 10/21/16 2343 10/22/16 0445 10/22/16 0752 10/22/16 1124 10/22/16 1524  GLUCAP 117* 121* 115* 152* 131*   Anemia work up No results for input(s): VITAMINB12, FOLATE, FERRITIN, TIBC, IRON, RETICCTPCT in the last 72 hours. Urinalysis    Component Value Date/Time   COLORURINE YELLOW 08/14/2009 1601   APPEARANCEUR CLOUDY (A) 08/14/2009 1601   LABSPEC 1.030 08/14/2009 1601   PHURINE 6.5 08/14/2009 1601   GLUCOSEU NEGATIVE 08/14/2009 1601   HGBUR LARGE (A) 08/14/2009 1601   BILIRUBINUR NEGATIVE 08/14/2009 1601   KETONESUR NEGATIVE 08/14/2009 1601   PROTEINUR NEGATIVE 08/14/2009 1601   UROBILINOGEN 1.0 08/14/2009 1601   NITRITE NEGATIVE 08/14/2009 1601   LEUKOCYTESUR NEGATIVE 08/14/2009 1601   Sepsis Labs Invalid input(s): PROCALCITONIN,  WBC,  LACTICIDVEN     SIGNED:  Clint LippsELMAHI,Camren Henthorn A, MD  [facility] 11/24/2016, 10:09 AM Pager   If 7PM-7AM, please contact night-coverage [email or web address] Password [email or web address]

## 2016-11-27 NOTE — Progress Notes (Signed)
Diluadid drip 80ml wasted at this time. Rakita RN witnessed.

## 2016-11-27 NOTE — Final Progress Note (Signed)
Palliative Care Progress Note  Reason for encounter: Terminal care, symptom management including shortness of breath and excess secretions  Met at bedside with patient's son.  He reports that she had a good night, but with increased secretions this AM.  She then "let out a little moan and got quiet as you walked in the door."  We were discussing management options for secretions when noted that she had ceased to breathe.  On exam: No withdrawal to stimuli Pupils unreactive No corneal reflex No palpable central or peripheral pulses No auscultatable heart or lung sounds for > 1 minute  Time of death: 38  Son present in room.  Notified Dr. Hartford Poli  Total time: 15 minutes Greater than 50%  of this time was spent counseling and coordinating care related to the above assessment and plan.   Micheline Rough, MD [place] [facility] [phone]

## 2016-11-27 NOTE — Progress Notes (Signed)
Responded to [facility] Consult to support patient and family.  Upon arrival to unit patient had passed away.   At bedside were Palliative care nurse and Patient son and daughter. Family were tearful ,coping and accepting. I listenned as children shared their love for mother and reflectived on her relationship with God. They found comfort in the fact that she was a [employer].  They actually said that she was in a better place.  I Provided prayer, emotional and grief support and remained on unit until their departure.    10/28/2016 0956  Clinical Encounter Type  Visited With Patient and family together;Health care provider  Visit Type Initial;Spiritual support;Death  Referral From Nurse  Spiritual Encounters  Spiritual Needs Prayer;Emotional;Grief support  Stress Factors  Family Stress Factors Exhausted;Loss  Venida JarvisWatlington, Marbeth Smedley, [place],pager, [phone]

## 2016-11-27 DEATH — deceased

## 2016-12-02 ENCOUNTER — Ambulatory Visit: Payer: Medicare Other | Admitting: Cardiology
